# Patient Record
Sex: Female | Born: 1980 | Race: White | Hispanic: No | Marital: Married | Smoking: Never smoker
Health system: Southern US, Community
[De-identification: ages and names within clinical notes are randomized; demographics above are authoritative.]

## PROBLEM LIST (undated history)

## (undated) DIAGNOSIS — E039 Hypothyroidism, unspecified: Secondary | ICD-10-CM

## (undated) DIAGNOSIS — R519 Headache, unspecified: Secondary | ICD-10-CM

## (undated) DIAGNOSIS — K219 Gastro-esophageal reflux disease without esophagitis: Secondary | ICD-10-CM

## (undated) DIAGNOSIS — E785 Hyperlipidemia, unspecified: Secondary | ICD-10-CM

## (undated) DIAGNOSIS — Z8614 Personal history of Methicillin resistant Staphylococcus aureus infection: Secondary | ICD-10-CM

## (undated) DIAGNOSIS — R51 Headache: Secondary | ICD-10-CM

## (undated) DIAGNOSIS — O021 Missed abortion: Secondary | ICD-10-CM

## (undated) DIAGNOSIS — Z803 Family history of malignant neoplasm of breast: Secondary | ICD-10-CM

## (undated) DIAGNOSIS — T7840XA Allergy, unspecified, initial encounter: Secondary | ICD-10-CM

## (undated) DIAGNOSIS — J45909 Unspecified asthma, uncomplicated: Secondary | ICD-10-CM

## (undated) DIAGNOSIS — F419 Anxiety disorder, unspecified: Secondary | ICD-10-CM

## (undated) DIAGNOSIS — D649 Anemia, unspecified: Secondary | ICD-10-CM

## (undated) DIAGNOSIS — M1990 Unspecified osteoarthritis, unspecified site: Secondary | ICD-10-CM

## (undated) DIAGNOSIS — Z8709 Personal history of other diseases of the respiratory system: Secondary | ICD-10-CM

## (undated) HISTORY — DX: Hyperlipidemia, unspecified: E78.5

## (undated) HISTORY — DX: Missed abortion: O02.1

## (undated) HISTORY — DX: Anxiety disorder, unspecified: F41.9

## (undated) HISTORY — PX: SHOULDER ARTHROSCOPY: SHX128

## (undated) HISTORY — PX: EYE SURGERY: SHX253

## (undated) HISTORY — DX: Family history of malignant neoplasm of breast: Z80.3

## (undated) HISTORY — DX: Allergy, unspecified, initial encounter: T78.40XA

## (undated) HISTORY — PX: WISDOM TOOTH EXTRACTION: SHX21

## (undated) HISTORY — PX: TONSILLECTOMY: SUR1361

## (undated) HISTORY — DX: Personal history of other diseases of the respiratory system: Z87.09

## (undated) HISTORY — PX: DILATION AND CURETTAGE OF UTERUS: SHX78

---

## 2006-12-06 HISTORY — PX: BREAST CYST ASPIRATION: SHX578

## 2007-12-07 HISTORY — PX: BREAST CYST ASPIRATION: SHX578

## 2009-03-27 ENCOUNTER — Ambulatory Visit: Payer: Self-pay | Admitting: Obstetrics & Gynecology

## 2010-02-26 ENCOUNTER — Inpatient Hospital Stay: Payer: Self-pay

## 2012-12-01 ENCOUNTER — Inpatient Hospital Stay: Payer: Self-pay | Admitting: Obstetrics and Gynecology

## 2012-12-01 LAB — CBC WITH DIFFERENTIAL/PLATELET
Basophil #: 0.1 10*3/uL (ref 0.0–0.1)
Basophil %: 0.7 %
Eosinophil #: 0.1 10*3/uL (ref 0.0–0.7)
Eosinophil %: 1 %
HCT: 38.9 % (ref 35.0–47.0)
HGB: 13.3 g/dL (ref 12.0–16.0)
Lymphocyte #: 1.6 10*3/uL (ref 1.0–3.6)
Lymphocyte %: 11.6 %
MCH: 32.3 pg (ref 26.0–34.0)
MCHC: 34.2 g/dL (ref 32.0–36.0)
MCV: 94 fL (ref 80–100)
Monocyte #: 0.7 x10 3/mm (ref 0.2–0.9)
Monocyte %: 5 %
Neutrophil #: 11.5 10*3/uL — ABNORMAL HIGH (ref 1.4–6.5)
Neutrophil %: 81.7 %
Platelet: 146 10*3/uL — ABNORMAL LOW (ref 150–440)
RBC: 4.13 10*6/uL (ref 3.80–5.20)
RDW: 14.1 % (ref 11.5–14.5)
WBC: 14.1 10*3/uL — ABNORMAL HIGH (ref 3.6–11.0)

## 2012-12-02 LAB — HEMATOCRIT: HCT: 36 % (ref 35.0–47.0)

## 2015-04-15 NOTE — H&P (Signed)
L&D Evaluation:  History:   HPI 34yo G2P1001 at 5557w1d presented with c/o contractions increasing in intensity and frequency.  No LOF or VB.  Good FM.  [facility]  PNL - O+ , GBS neg, RI, VNI    Presents with contractions    Patient's Medical History No Chronic Illness    Patient's Surgical History none    Medications Pre Natal Vitamins    Allergies NKDA    Social History none    Family History Non-Contributory   ROS:   ROS All systems were reviewed.  HEENT, CNS, GI, GU, Respiratory, CV, Renal and Musculoskeletal systems were found to be normal.   Exam:   Vital Signs stable    General no apparent distress    Chest normal effort    Abdomen gravid, non-tender    Estimated Fetal Weight Average for gestational age, 8#    Pelvic no external lesions, 3cm to 4/100 over 4 hours on L&D    Mebranes Intact    FHT normal rate with no decels, reactive NST    Ucx regular    Skin dry   Impression:   Impression active labor, at 2057w1d   Plan:   Plan EFM/NST, monitor contractions and for cervical change, continue expectant management   Electronic Signatures: Garnette GunnerStansbury Clipp, Ali LoweEryn K (MD)  (Signed 27-Dec-13 16:06)  Authored: L&D Evaluation   Last Updated: 27-Dec-13 16:06 by Garnette GunnerStansbury Clipp, Ali LoweEryn K (MD)

## 2015-06-25 ENCOUNTER — Other Ambulatory Visit: Payer: Self-pay | Admitting: Orthopedic Surgery

## 2015-06-25 DIAGNOSIS — M25561 Pain in right knee: Secondary | ICD-10-CM

## 2015-07-02 ENCOUNTER — Ambulatory Visit: Payer: Self-pay

## 2015-07-03 ENCOUNTER — Ambulatory Visit
Admission: RE | Admit: 2015-07-03 | Discharge: 2015-07-03 | Disposition: A | Discharge status: Home / Self Care | Payer: BLUE CROSS/BLUE SHIELD | Source: Ambulatory Visit | Attending: Orthopedic Surgery | Admitting: Orthopedic Surgery

## 2015-07-03 DIAGNOSIS — M25561 Pain in right knee: Secondary | ICD-10-CM | POA: Diagnosis present

## 2015-07-03 DIAGNOSIS — M2240 Chondromalacia patellae, unspecified knee: Secondary | ICD-10-CM | POA: Diagnosis not present

## 2017-10-18 DIAGNOSIS — B9689 Other specified bacterial agents as the cause of diseases classified elsewhere: Secondary | ICD-10-CM | POA: Diagnosis not present

## 2017-10-18 DIAGNOSIS — J209 Acute bronchitis, unspecified: Secondary | ICD-10-CM | POA: Diagnosis not present

## 2017-10-18 DIAGNOSIS — J019 Acute sinusitis, unspecified: Secondary | ICD-10-CM | POA: Diagnosis not present

## 2017-11-24 DIAGNOSIS — B9689 Other specified bacterial agents as the cause of diseases classified elsewhere: Secondary | ICD-10-CM | POA: Diagnosis not present

## 2017-11-24 DIAGNOSIS — J019 Acute sinusitis, unspecified: Secondary | ICD-10-CM | POA: Diagnosis not present

## 2017-12-21 DIAGNOSIS — M79672 Pain in left foot: Secondary | ICD-10-CM | POA: Diagnosis not present

## 2017-12-21 DIAGNOSIS — M722 Plantar fascial fibromatosis: Secondary | ICD-10-CM | POA: Diagnosis not present

## 2018-01-03 DIAGNOSIS — J029 Acute pharyngitis, unspecified: Secondary | ICD-10-CM | POA: Diagnosis not present

## 2018-01-03 DIAGNOSIS — J0101 Acute recurrent maxillary sinusitis: Secondary | ICD-10-CM | POA: Diagnosis not present

## 2018-01-03 DIAGNOSIS — J209 Acute bronchitis, unspecified: Secondary | ICD-10-CM | POA: Diagnosis not present

## 2018-01-16 ENCOUNTER — Other Ambulatory Visit: Payer: Self-pay

## 2018-01-16 DIAGNOSIS — M722 Plantar fascial fibromatosis: Secondary | ICD-10-CM | POA: Diagnosis not present

## 2018-01-16 DIAGNOSIS — M79672 Pain in left foot: Secondary | ICD-10-CM | POA: Diagnosis not present

## 2018-01-16 MED ORDER — NORGESTIM-ETH ESTRAD TRIPHASIC 0.18/0.215/0.25 MG-35 MCG PO TABS: 1.0000 | ORAL_TABLET | Freq: Every day | ORAL | 0 refills | Status: DC

## 2018-01-16 NOTE — Telephone Encounter (Signed)
Pt needing 1 month supply of OCP's to last her to annual 2/27. Pt aware we will send one month but no more can be sent in until she is seen for appt

## 2018-01-24 ENCOUNTER — Other Ambulatory Visit: Payer: Self-pay | Admitting: Otolaryngology

## 2018-01-24 DIAGNOSIS — J3501 Chronic tonsillitis: Secondary | ICD-10-CM | POA: Diagnosis not present

## 2018-01-24 DIAGNOSIS — J351 Hypertrophy of tonsils: Secondary | ICD-10-CM | POA: Diagnosis not present

## 2018-01-24 DIAGNOSIS — J329 Chronic sinusitis, unspecified: Secondary | ICD-10-CM | POA: Diagnosis not present

## 2018-01-24 DIAGNOSIS — J320 Chronic maxillary sinusitis: Secondary | ICD-10-CM | POA: Diagnosis not present

## 2018-01-27 ENCOUNTER — Ambulatory Visit
Admission: RE | Admit: 2018-01-27 | Discharge: 2018-01-27 | Disposition: A | Discharge status: Home / Self Care | Payer: 59 | Source: Ambulatory Visit | Attending: Otolaryngology | Admitting: Otolaryngology

## 2018-01-27 DIAGNOSIS — J029 Acute pharyngitis, unspecified: Secondary | ICD-10-CM | POA: Diagnosis not present

## 2018-01-27 DIAGNOSIS — J329 Chronic sinusitis, unspecified: Secondary | ICD-10-CM | POA: Insufficient documentation

## 2018-01-27 DIAGNOSIS — J0100 Acute maxillary sinusitis, unspecified: Secondary | ICD-10-CM | POA: Diagnosis not present

## 2018-02-01 ENCOUNTER — Ambulatory Visit: Payer: Self-pay | Admitting: Advanced Practice Midwife

## 2018-02-03 DIAGNOSIS — Z8614 Personal history of Methicillin resistant Staphylococcus aureus infection: Secondary | ICD-10-CM

## 2018-02-03 HISTORY — DX: Personal history of Methicillin resistant Staphylococcus aureus infection: Z86.14

## 2018-02-09 ENCOUNTER — Encounter: Payer: Self-pay | Admitting: Obstetrics & Gynecology

## 2018-02-10 ENCOUNTER — Encounter: Payer: Self-pay | Admitting: Obstetrics & Gynecology

## 2018-02-10 ENCOUNTER — Ambulatory Visit (INDEPENDENT_AMBULATORY_CARE_PROVIDER_SITE_OTHER): Payer: 59 | Admitting: Obstetrics & Gynecology

## 2018-02-10 VITALS — BP 110/80 | HR 71 | Ht 63.5 in | Wt 180.0 lb

## 2018-02-10 DIAGNOSIS — Z131 Encounter for screening for diabetes mellitus: Secondary | ICD-10-CM | POA: Diagnosis not present

## 2018-02-10 DIAGNOSIS — Z1321 Encounter for screening for nutritional disorder: Secondary | ICD-10-CM

## 2018-02-10 DIAGNOSIS — Z124 Encounter for screening for malignant neoplasm of cervix: Secondary | ICD-10-CM

## 2018-02-10 DIAGNOSIS — Z1329 Encounter for screening for other suspected endocrine disorder: Secondary | ICD-10-CM | POA: Diagnosis not present

## 2018-02-10 DIAGNOSIS — Z0000 Encounter for general adult medical examination without abnormal findings: Secondary | ICD-10-CM

## 2018-02-10 DIAGNOSIS — Z13220 Encounter for screening for lipoid disorders: Secondary | ICD-10-CM

## 2018-02-10 MED ORDER — NORGESTIM-ETH ESTRAD TRIPHASIC 0.18/0.215/0.25 MG-35 MCG PO TABS: 1.0000 | ORAL_TABLET | Freq: Every day | ORAL | 3 refills | Status: DC

## 2018-02-10 NOTE — Patient Instructions (Signed)
PAP every three years Labs soon   

## 2018-02-10 NOTE — Progress Notes (Signed)
HPI:      [name] is a 37 y.o. [id number] who LMP was Patient's last menstrual period was 02/09/2018.,she presents today for her annual examination. The patient has no complaints today. Light periods on OCPs.  Occas GSI, does Kegels. Prior h/o FCC, no recent pain or concerns. The patient is sexually active. Her last pap: approximate date 2016 and was normal. The patient does perform self breast exams.  There is notable family history of breast or ovarian cancer in her family.  The patient has regular exercise: yes.  The patient denies current symptoms of depression.    GYN History: Contraception: OCP (estrogen/progesterone)  PMHx: Past Medical History:  Diagnosis Date  . Abortion, missed   . Breast mass    Past Surgical History:  Procedure Laterality Date  . DILATION AND CURETTAGE OF UTERUS    . EYE SURGERY     Family History  Problem Relation Age of Onset  . Hypertension Mother   . Hyperlipidemia Mother   . Diabetes Mother   . Heart disease Father   . CAD Father   . Hyperlipidemia Father   . Diabetes Maternal Grandmother   . Hypertension Maternal Grandmother   . Heart disease Paternal Grandmother   . Heart disease Paternal Grandfather   . Breast cancer Maternal Aunt   . Heart disease Other    Social History   Tobacco Use  . Smoking status: Never Smoker  . Smokeless tobacco: Never Used  Substance Use Topics  . Alcohol use: Yes    Frequency: Never  . Drug use: No    Current Outpatient Medications:  .  albuterol (PROVENTIL HFA) 108 (90 Base) MCG/ACT inhaler, Inhale into the lungs., Disp: , Rfl:  .  Norgestimate-Ethinyl Estradiol Triphasic (TRINESSA, 28,) 0.18/0.215/0.25 MG-35 MCG tablet, Take 1 tablet by mouth daily., Disp: 3 Package, Rfl: 3 Allergies: Patient has no known allergies.  Review of Systems  Constitutional: Negative for chills, fever and malaise/fatigue.  HENT: Negative for congestion, sinus pain and sore throat.   Eyes: Negative for blurred  vision and pain.  Respiratory: Positive for cough and wheezing.   Cardiovascular: Negative for chest pain and leg swelling.  Gastrointestinal: Negative for abdominal pain, constipation, diarrhea, heartburn, nausea and vomiting.  Genitourinary: Negative for dysuria, frequency, hematuria and urgency.  Musculoskeletal: Positive for joint pain. Negative for back pain, myalgias and neck pain.  Skin: Negative for itching and rash.  Neurological: Positive for headaches. Negative for dizziness, tremors and weakness.  Endo/Heme/Allergies: Does not bruise/bleed easily.  Psychiatric/Behavioral: Negative for depression. The patient is nervous/anxious. The patient does not have insomnia.    Objective: BP 110/80   Pulse 71   Ht 5' 3.5" (1.613 m)   Wt 180 lb (81.6 kg)   LMP 02/09/2018   BMI 31.39 kg/m   Filed Weights   02/10/18 0925  Weight: 180 lb (81.6 kg)   Body mass index is 31.39 kg/m. Physical Exam  Constitutional: She is oriented to person, place, and time. She appears well-developed and well-nourished. No distress.  Genitourinary: Rectum normal, vagina normal and uterus normal. Pelvic exam was performed with patient supine. There is no rash or lesion on the right labia. There is no rash or lesion on the left labia. Vagina exhibits no lesion. No bleeding in the vagina. Right adnexum does not display mass and does not display tenderness. Left adnexum does not display mass and does not display tenderness. Cervix does not exhibit motion tenderness, lesion, friability or polyp.  Uterus is mobile and midaxial. Uterus is not enlarged or exhibiting a mass.  HENT:  Head: Normocephalic and atraumatic. Head is without laceration.  Right Ear: Hearing normal.  Left Ear: Hearing normal.  Nose: No epistaxis.  No foreign bodies.  Mouth/Throat: Uvula is midline, oropharynx is clear and moist and mucous membranes are normal.  Eyes: Pupils are equal, round, and reactive to light.  Neck: Normal range of  motion. Neck supple. No thyromegaly present.  Cardiovascular: Normal rate and regular rhythm. Exam reveals no gallop and no friction rub.  No murmur heard. Pulmonary/Chest: Effort normal and breath sounds normal. No respiratory distress. She has no wheezes. Right breast exhibits no mass, no skin change and no tenderness. Left breast exhibits no mass, no skin change and no tenderness.  Abdominal: Soft. Bowel sounds are normal. She exhibits no distension. There is no tenderness. There is no rebound.  Musculoskeletal: Normal range of motion.  Neurological: She is alert and oriented to person, place, and time. No cranial nerve deficit.  Skin: Skin is warm and dry.  Psychiatric: She has a normal mood and affect. Judgment normal.  Vitals reviewed.  Assessment:  ANNUAL EXAM 1. Annual physical exam   2. Screening for cervical cancer   3. Screening for cholesterol level   4. Screening for thyroid disorder   5. Encounter for vitamin deficiency screening   6. Screening for diabetes mellitus    Screening Plan:            1.  Cervical Screening-  Pap smear done today  2. Breast screening- Exam annually and mammogram>40 planned   3. Colonoscopy every 10 years, Hemoccult testing - after age 8  4. Labs To return fasting at a later date  5. Counseling for contraception: oral contraceptives (estrogen/progesterone).  Considering future pregnancy options, but not likely.  6.  She presents with a significant personal and/or family history of breast cancer maternal aunt maternal aunt (age 80s), also paternal cousins and great aunts. Details of which can be found in her medical/family history. She does not have a previously identified BRCA and Lynch syndrome mutation in her family. Due to her personal and/or family history of cancer she is a candidate for the [facility] test(s).    Risk for cancer, genetic susceptibility discussed.  Patient has declined gene testing.  Discussed BRCA as well as Lynch syndrome  and other cancer risk assessments available based on her family history and personal history. Pros and cons of testing discussed.   Other:   F/U  Return in about 1 year (around 02/11/2019) for Annual.  Barnett Applebaum, MD, [facility], [facility] 02/10/2018  9:54 AM

## 2018-02-14 ENCOUNTER — Encounter: Payer: Self-pay | Admitting: Obstetrics and Gynecology

## 2018-02-14 ENCOUNTER — Other Ambulatory Visit: Payer: 59

## 2018-02-14 DIAGNOSIS — Z1321 Encounter for screening for nutritional disorder: Secondary | ICD-10-CM | POA: Diagnosis not present

## 2018-02-14 DIAGNOSIS — Z13220 Encounter for screening for lipoid disorders: Secondary | ICD-10-CM | POA: Diagnosis not present

## 2018-02-14 DIAGNOSIS — Z1329 Encounter for screening for other suspected endocrine disorder: Secondary | ICD-10-CM | POA: Diagnosis not present

## 2018-02-14 DIAGNOSIS — Z131 Encounter for screening for diabetes mellitus: Secondary | ICD-10-CM | POA: Diagnosis not present

## 2018-02-14 DIAGNOSIS — R7989 Other specified abnormal findings of blood chemistry: Secondary | ICD-10-CM

## 2018-02-14 LAB — IGP, APTIMA HPV
HPV Aptima: NEGATIVE
PAP Smear Comment: 0

## 2018-02-15 LAB — TSH: TSH: 5.8 u[IU]/mL — ABNORMAL HIGH (ref 0.450–4.500)

## 2018-02-15 LAB — VITAMIN D 25 HYDROXY (VIT D DEFICIENCY, FRACTURES): Vit D, 25-Hydroxy: 36.3 ng/mL (ref 30.0–100.0)

## 2018-02-15 LAB — LIPID PANEL
Chol/HDL Ratio: 4.3 ratio (ref 0.0–4.4)
Cholesterol, Total: 241 mg/dL — ABNORMAL HIGH (ref 100–199)
HDL: 56 mg/dL (ref >39)
LDL Calculated: 161 mg/dL — ABNORMAL HIGH (ref 0–99)
Triglycerides: 119 mg/dL (ref 0–149)
VLDL Cholesterol Cal: 24 mg/dL (ref 5–40)

## 2018-02-15 LAB — VITAMIN B12: Vitamin B-12: 597 pg/mL (ref 232–1245)

## 2018-02-15 LAB — GLUCOSE, FASTING: Glucose, Plasma: 83 mg/dL (ref 65–99)

## 2018-02-28 DIAGNOSIS — K219 Gastro-esophageal reflux disease without esophagitis: Secondary | ICD-10-CM | POA: Diagnosis not present

## 2018-02-28 DIAGNOSIS — J3501 Chronic tonsillitis: Secondary | ICD-10-CM | POA: Diagnosis not present

## 2018-02-28 DIAGNOSIS — J320 Chronic maxillary sinusitis: Secondary | ICD-10-CM | POA: Diagnosis not present

## 2018-04-27 ENCOUNTER — Ambulatory Visit (INDEPENDENT_AMBULATORY_CARE_PROVIDER_SITE_OTHER): Payer: 59 | Admitting: Cardiovascular Disease

## 2018-04-27 ENCOUNTER — Encounter: Payer: Self-pay | Admitting: Cardiovascular Disease

## 2018-04-27 VITALS — BP 120/80 | HR 66 | Ht 64.0 in | Wt 179.0 lb

## 2018-04-27 DIAGNOSIS — R200 Anesthesia of skin: Secondary | ICD-10-CM | POA: Diagnosis not present

## 2018-04-27 DIAGNOSIS — E782 Mixed hyperlipidemia: Secondary | ICD-10-CM

## 2018-04-27 DIAGNOSIS — Z01818 Encounter for other preprocedural examination: Secondary | ICD-10-CM | POA: Diagnosis not present

## 2018-04-27 DIAGNOSIS — Z8249 Family history of ischemic heart disease and other diseases of the circulatory system: Secondary | ICD-10-CM | POA: Diagnosis not present

## 2018-04-27 NOTE — Progress Notes (Signed)
Cardiology Office Note  Date:  04/27/2018   ID:  Pamela Hamilton, DOB 1981/04/18, MRN [id number]  PCP:  Patient, No Pcp Per   Chief Complaint  Patient presents with  . New Patient (Initial Visit)    Surgical Clearance. Patient c/o leg numbness in both legs.  Patient denies chest pain and SOB. Meds reviewed verbally with patient.     HPI:  [name] is a 37 year old woman with past medical history of Hyperlipidemia Family history of premature coronary disease Who presents by referral from Dr. Andee Poles for consultation of her premature coronary disease, discussion of her risk factors, preoperative evaluation  She reports that she needs to have surgery to remove her tonsils No prior cardiac history Very concerned about risk factors given father was 36 when he first developed cardiac issues Other close relatives with cardiac disease at early age  She is mother of 2 children 8 and 5, girls Able to keep up with them, no limitations No regular exercise program  Somewhat limited by chronic knee pain  plantar fasciitis  Reports having Remote pressure in the chest,  Also has periodic reproducible chest pain on palpation left chest  EKG personally reviewed by myself on todays visit Shows no sinus rhythm rate 66 bpm no significant ST or T-wave changes    PMH:   has a past medical history of Abortion, missed, Breast mass, Family history of breast cancer, and Family history of ovarian cancer.  PSH:    Past Surgical History:  Procedure Laterality Date  . DILATION AND CURETTAGE OF UTERUS    . EYE SURGERY      Current Outpatient Medications  Medication Sig Dispense Refill  . albuterol (PROVENTIL HFA) 108 (90 Base) MCG/ACT inhaler Inhale into the lungs.    . fluticasone (FLONASE) 50 MCG/ACT nasal spray fluticasone propionate 50 mcg/actuation nasal spray,suspension    . Multiple Vitamin (MULTI-VITAMINS) TABS Take by mouth.    . Norgestimate-Ethinyl Estradiol Triphasic  (TRINESSA, 28,) 0.18/0.215/0.25 MG-35 MCG tablet Take 1 tablet by mouth daily. 3 Package 3  . Omega-3 Fatty Acids (RA FISH OIL) 1000 MG CAPS Take by mouth.     No current facility-administered medications for this visit.     Allergies:   Patient has no known allergies.   Social History:  The patient  reports that she has never smoked. She has never used smokeless tobacco. She reports that she drinks alcohol. She reports that she does not use drugs.   Family History:   family history includes Breast cancer in her cousin, maternal aunt, and paternal aunt; CAD in her father; Diabetes in her maternal grandmother and mother; Heart disease in her father, other, paternal grandfather, and paternal grandmother; Hyperlipidemia in her father and mother; Hypertension in her maternal grandmother and mother; Ovarian cancer in her cousin.    Review of Systems: Review of Systems  Constitutional: Negative.   Respiratory: Negative.   Cardiovascular: Negative.   Gastrointestinal: Negative.   Musculoskeletal: Negative.   Neurological: Negative.   Psychiatric/Behavioral: Negative.   All other systems reviewed and are negative.    PHYSICAL EXAM: VS:  BP 120/80 (BP Location: Right Arm, Patient Position: Sitting, Cuff Size: Normal)   Pulse 66   Ht  (1.626 m)   Wt 179 lb (81.2 kg)   BMI 30.73 kg/m  , BMI Body mass index is 30.73 kg/m. GEN: Well nourished, well developed, in no acute distress  HEENT: normal  Neck: no JVD, carotid bruits, or masses Cardiac: RRR;  no murmurs, rubs, or gallops,no edema  Respiratory:  clear to auscultation bilaterally, normal work of breathing GI: soft, nontender, nondistended, + BS MS: no deformity or atrophy  Skin: warm and dry, no rash Neuro:  Strength and sensation are intact Psych: euthymic mood, full affect    Recent Labs: 02/14/2018: TSH 5.800    Lipid Panel Lab Results  Component Value Date   CHOL 241 (H) 02/14/2018   HDL 56 02/14/2018   LDLCALC  161 (H) 02/14/2018   TRIG 119 02/14/2018      Wt Readings from Last 3 Encounters:  04/27/18 179 lb (81.2 kg)  02/10/18 180 lb (81.6 kg)  07/03/15 175 lb (79.4 kg)       ASSESSMENT AND PLAN:  Preoperative clearance - Plan: EKG 12-Lead Acceptable risk for surgery to remove tonsils No further testing needed  Family history of premature coronary artery disease Premature coronary disease in  first-degree relatives, and more distant relatives Much of her visit was spent discussing Risk factor modification, management of cholesterol, screening studies for risk stratification -Suggested she consider CT coronary calcium scoring -Calcium scoring above could help guide her lipid management Recommended weight loss, regular exercise program Consider a statin/Zetia  Mixed hyperlipidemia As above would stress risk factor Modification, weight loss for cholesterol She is not a diabetic, nonsmoker Consider statin, Zetia  Leg numbness Atypical in nature,does not appear to be vascular given good extremity pulses Also with periodic numbness in her arms bilaterally, Good radial artery pulses She already wears compression hose  Disposition:   F/U  As needed   Total encounter time more than 60 minutes  Greater than 50% was spent in counseling and coordination of care with the patient  Patient was seen in consultation for Dr. Andee Poles and will be referred back to his office for ongoing care of the medical issues detailed above    Orders Placed This Encounter  Procedures  . EKG 12-Lead     Signed, Dossie Arbour, M.D., Ph.D. 04/27/2018  [facility] [place], [place] [phone]

## 2018-04-27 NOTE — Patient Instructions (Addendum)
Medication Instructions:   No medication changes made  Labwork:  No new labs needed  Testing/Procedures:  No further testing at this time  Research CT coronary calcium score   Follow-Up: It was a pleasure seeing you in the office today. Please call us if you have new issues that need to be addressed before your next appt.  [phone]  Your physician wants you to follow-up in:  As needed  If you need a refill on your cardiac medications before your next appointment, please call your pharmacy.  For educational health videos Log in to : [email or web address] Or : [email or web address], password : [email or web address]

## 2018-07-04 ENCOUNTER — Encounter: Payer: Self-pay | Admitting: Obstetrics and Gynecology

## 2018-07-04 ENCOUNTER — Ambulatory Visit (INDEPENDENT_AMBULATORY_CARE_PROVIDER_SITE_OTHER): Payer: 59 | Admitting: Obstetrics and Gynecology

## 2018-07-04 VITALS — BP 130/82 | HR 73 | Ht 63.5 in | Wt 182.0 lb

## 2018-07-04 DIAGNOSIS — K219 Gastro-esophageal reflux disease without esophagitis: Secondary | ICD-10-CM | POA: Diagnosis not present

## 2018-07-04 DIAGNOSIS — Z1231 Encounter for screening mammogram for malignant neoplasm of breast: Secondary | ICD-10-CM | POA: Diagnosis not present

## 2018-07-04 DIAGNOSIS — N6312 Unspecified lump in the right breast, upper inner quadrant: Secondary | ICD-10-CM | POA: Diagnosis not present

## 2018-07-04 DIAGNOSIS — Z803 Family history of malignant neoplasm of breast: Secondary | ICD-10-CM | POA: Diagnosis not present

## 2018-07-04 DIAGNOSIS — Z1239 Encounter for other screening for malignant neoplasm of breast: Secondary | ICD-10-CM

## 2018-07-04 DIAGNOSIS — J351 Hypertrophy of tonsils: Secondary | ICD-10-CM | POA: Diagnosis not present

## 2018-07-04 DIAGNOSIS — N6310 Unspecified lump in the right breast, unspecified quadrant: Secondary | ICD-10-CM

## 2018-07-04 NOTE — Progress Notes (Signed)
Chief Complaint  Patient presents with  . Breast Mass    Lump in right breast, tender to touch, size of pea, doesnt move when pushed on x 4 days      HPI:      [name] is a 37 y.o. [id number] who LMP was Patient's last menstrual period was 06/20/2018 (approximate)., presents today for breast symptoms. She complains of RT breast mass noted on SBE 4 days ago. Area is tender after palpation. Ne erythema, nipple d/c, change in size since first noted. She drinks caffeine daily. Has mild breast tenderness before menses. Hx of RT breast cyst aspiration in past twice at [facility]. Never had a mammo. Pt has FH breast cancer in her mat aunt and pat cousin and 2 pat great aunts. No FH ovar/colon ca. [facility] but it hasn't been done in past.    Past Medical History:  Diagnosis Date  . Abortion, missed   . Breast mass   . Family history of breast cancer    genetic testing letter sent 3/19  . History of strep sore throat     Past Surgical History:  Procedure Laterality Date  . DILATION AND CURETTAGE OF UTERUS    . EYE SURGERY      Family History  Problem Relation Age of Onset  . Hypertension Mother   . Hyperlipidemia Mother   . Diabetes Mother   . Heart disease Father   . CAD Father   . Hyperlipidemia Father   . Diabetes Maternal Grandmother   . Hypertension Maternal Grandmother   . Heart disease Paternal Grandmother   . Heart disease Paternal Grandfather   . Breast cancer Maternal Aunt 43  . Heart disease Other   . Breast cancer Cousin 35  . Breast cancer Other   . Breast cancer Other     Current Outpatient Medications on File Prior to Visit  Medication Sig Dispense Refill  . albuterol (PROVENTIL HFA) 108 (90 Base) MCG/ACT inhaler Inhale into the lungs.    . Ascorbic Acid (VITAMIN C) 1000 MG tablet Take 1,000 mg by mouth daily.    . Multiple Vitamin (MULTI-VITAMINS) TABS Take by mouth.    . Norgestimate-Ethinyl Estradiol Triphasic  (TRINESSA, 28,) 0.18/0.215/0.25 MG-35 MCG tablet Take 1 tablet by mouth daily. 3 Package 3  . Omega-3 Fatty Acids (RA FISH OIL) 1000 MG CAPS Take by mouth.    . fluticasone (FLONASE) 50 MCG/ACT nasal spray fluticasone propionate 50 mcg/actuation nasal spray,suspension     No current facility-administered medications on file prior to visit.      ROS:  Review of Systems  Constitutional: Negative for fever, malaise/fatigue and weight loss.  Gastrointestinal: Negative for blood in stool, constipation, diarrhea, nausea and vomiting.  Genitourinary: Negative for dysuria, flank pain, frequency, hematuria and urgency.  Musculoskeletal: Negative for back pain.  Skin: Negative for itching and rash.  Breast ROS: positive for - new or changing breast lumps   Objective: BP 130/82   Pulse 73   Ht 5' 3.5" (1.613 m)   Wt 182 lb (82.6 kg)   LMP 06/20/2018 (Approximate)   BMI 31.73 kg/m    Physical Exam  Constitutional: She is oriented to person, place, and time. She appears well-developed.  Pulmonary/Chest: Right breast exhibits mass. Right breast exhibits no inverted nipple, no nipple discharge, no skin change and no tenderness. Left breast exhibits no inverted nipple, no mass, no nipple discharge, no skin change and no tenderness.  RT BREAST  1:20 POS WITH 1 X 2CM FIRM, MOBILE, NT MASS    Neurological: She is alert and oriented to person, place, and time. No cranial nerve deficit.  Skin: Skin is warm and dry.  Psychiatric: She has a normal mood and affect. Her behavior is normal. Thought content normal.  Vitals reviewed.   Assessment/Plan: Breast mass, right - 1:30 position. Check dx mamm and u/s.  Will f/u wiht results.  - Plan: US BREAST LTD UNI RIGHT INC AXILLA, US BREAST LTD UNI LEFT INC AXILLA, MM DIAG BREAST TOMO BILATERAL  Screening for breast cancer - Plan: US BREAST LTD UNI RIGHT INC AXILLA, US BREAST LTD UNI LEFT INC AXILLA, MM DIAG BREAST TOMO BILATERAL  Family history of  breast cancer - MyRisk testing discussed and handout given. Pt to discuss wiht husband and f/u if desires.   Orders Placed This Encounter  Procedures  . US BREAST LTD UNI RIGHT INC AXILLA    Standing Status:   Future    Standing Expiration Date:   07/05/2019    Order Specific Question:   Reason for Exam (SYMPTOM  OR DIAGNOSIS REQUIRED)    Answer:   Right breast mass    Order Specific Question:   Preferred imaging location?    Answer[facility]  . US BREAST LTD UNI LEFT INC AXILLA    Standing Status:   Future    Standing Expiration Date:   07/05/2019    Order Specific Question:   Reason for Exam (SYMPTOM  OR DIAGNOSIS REQUIRED)    Answer:   LT breast mass    Order Specific Question:   Preferred imaging location?    Answer:   [facility]  . MM DIAG BREAST TOMO BILATERAL    Standing Status:   Future    Standing Expiration Date:   07/05/2019    Order Specific Question:   Reason for Exam (SYMPTOM  OR DIAGNOSIS REQUIRED)    Answer:   breast mass    Order Specific Question:   Is the patient pregnant?    Answer:   No    Order Specific Question:   Preferred imaging location?    Answer:   [facility]      F/U  Return if symptoms worsen or fail to improve.  Alicia B. Copland, PA-C 07/04/2018 11:06 AM

## 2018-07-04 NOTE — Patient Instructions (Signed)
I value your feedback and entrusting us with your care. If you get a [facility] patient survey, I would appreciate you taking the time to let us know about your experience today. Thank you! 

## 2018-07-05 ENCOUNTER — Ambulatory Visit
Admission: RE | Admit: 2018-07-05 | Discharge: 2018-07-05 | Disposition: A | Discharge status: Home / Self Care | Payer: 59 | Source: Ambulatory Visit | Attending: Obstetrics and Gynecology | Admitting: Obstetrics and Gynecology

## 2018-07-05 DIAGNOSIS — Z1239 Encounter for other screening for malignant neoplasm of breast: Secondary | ICD-10-CM

## 2018-07-05 DIAGNOSIS — N6310 Unspecified lump in the right breast, unspecified quadrant: Secondary | ICD-10-CM | POA: Diagnosis not present

## 2018-07-05 DIAGNOSIS — Z1231 Encounter for screening mammogram for malignant neoplasm of breast: Secondary | ICD-10-CM | POA: Diagnosis not present

## 2018-07-05 DIAGNOSIS — N6001 Solitary cyst of right breast: Secondary | ICD-10-CM | POA: Diagnosis not present

## 2018-07-05 DIAGNOSIS — R922 Inconclusive mammogram: Secondary | ICD-10-CM | POA: Diagnosis not present

## 2018-07-06 ENCOUNTER — Encounter
Admission: RE | Admit: 2018-07-06 | Discharge: 2018-07-06 | Disposition: A | Discharge status: Home / Self Care | Payer: 59 | Source: Ambulatory Visit | Attending: Otolaryngology | Admitting: Otolaryngology

## 2018-07-06 ENCOUNTER — Other Ambulatory Visit: Payer: Self-pay

## 2018-07-06 HISTORY — DX: Hypothyroidism, unspecified: E03.9

## 2018-07-06 HISTORY — DX: Anemia, unspecified: D64.9

## 2018-07-06 HISTORY — DX: Gastro-esophageal reflux disease without esophagitis: K21.9

## 2018-07-06 HISTORY — DX: Unspecified osteoarthritis, unspecified site: M19.90

## 2018-07-06 HISTORY — DX: Headache: R51

## 2018-07-06 HISTORY — DX: Unspecified asthma, uncomplicated: J45.909

## 2018-07-06 HISTORY — DX: Headache, unspecified: R51.9

## 2018-07-06 NOTE — Patient Instructions (Addendum)
Your procedure is scheduled on: 07-13-18 THURSDAY Report to Same Day Surgery 2nd floor medical mall [facility] Entrance-take elevator on left to 2nd floor.  Check in with surgery information desk.) To find out your arrival time please call [phone] between 1PM - 3PM on 07-12-18 [facility]  Remember: Instructions that are not followed completely may result in serious medical risk, up to and including death, or upon the discretion of your surgeon and anesthesiologist your surgery may need to be rescheduled.    _x___ 1. Do not eat food after midnight the night before your procedure. NO GUM OR CANDY AFTER MIDNIGHT.  You may drink clear liquids up to 2 hours before you are scheduled to arrive at the hospital for your procedure.  Do not drink clear liquids within 2 hours of your scheduled arrival to the hospital.  Clear liquids include  --Water or Apple juice without pulp  --Clear carbohydrate beverage such as ClearFast or Gatorade  --Black Coffee or Clear Tea (No milk, no creamers, do not add anything to the coffee or Tea    ____Ensure clear carbohydrate drink on the way to the hospital for bariatric patients  ____Ensure clear carbohydrate drink 3 hours before surgery for Dr Rutherford Nail patients if physician instructed.      __x__ 2. No Alcohol for 24 hours before or after surgery.   __x__3. No Smoking or e-cigarettes for 24 prior to surgery.  Do not use any chewable tobacco products for at least 6 hour prior to surgery   ____  4. Bring all medications with you on the day of surgery if instructed.    __x__ 5. Notify your doctor if there is any change in your medical condition     (cold, fever, infections).    x___6. On the morning of surgery brush your teeth with toothpaste and water.  You may rinse your mouth with mouth wash if you wish.  Do not swallow any toothpaste or mouthwash.   Do not wear jewelry, make-up, hairpins, clips or nail polish.  Do not wear lotions, powders, or  perfumes. You may wear deodorant.  Do not shave 48 hours prior to surgery. Men may shave face and neck.  Do not bring valuables to the hospital.    [facility] is not responsible for any belongings or valuables.               Contacts, dentures or bridgework may not be worn into surgery.  Leave your suitcase in the car. After surgery it may be brought to your room.  For patients admitted to the hospital, discharge time is determined by your treatment team.  _  Patients discharged the day of surgery will not be allowed to drive home.  You will need someone to drive you home and stay with you the night of your procedure.    Please read over the following fact sheets that you were given:   [facility] Preparing for Surgery   ____ Take anti-hypertensive listed below, cardiac, seizure, asthma,anti-reflux and psychiatric medicines. These include:  1. NONE  2.  3.  4.  5.  6.  ____Fleets enema or Magnesium Citrate as directed.   ____ Use CHG Soap or sage wipes as directed on instruction sheet   _X___ Use inhalers on the day of surgery and bring to hospital day of surgery-BRING YOUR ALBUTEROL INHALER TO HOSPITAL  ____ Stop Metformin and Janumet 2 days prior to surgery.    ____ Take 1/2 of usual insulin dose  the night before surgery and none on the morning surgery.   ____ Follow recommendations from Cardiologist, Pulmonologist or PCP regarding stopping Aspirin, Coumadin, Plavix ,Eliquis, Effient, or Pradaxa, and Pletal.  X____Stop Anti-inflammatories such as Advil, Aleve, Ibuprofen, Motrin, Naproxen, Naprosyn, Goodies powders or aspirin products NOW-OK to take Tylenol    _X___ Stop supplements until after surgery-STOP FISH OIL NOW-MAY RESUME AFTER SURGERY   ____ Bring C-Pap to the hospital.

## 2018-07-07 ENCOUNTER — Inpatient Hospital Stay: Admission: RE | Admit: 2018-07-07 | Payer: 59 | Source: Ambulatory Visit

## 2018-07-10 ENCOUNTER — Encounter
Admission: RE | Admit: 2018-07-10 | Discharge: 2018-07-10 | Disposition: A | Discharge status: Home / Self Care | Payer: 59 | Source: Ambulatory Visit | Attending: Otolaryngology | Admitting: Otolaryngology

## 2018-07-10 DIAGNOSIS — J3501 Chronic tonsillitis: Secondary | ICD-10-CM | POA: Diagnosis not present

## 2018-07-10 LAB — HEMOGLOBIN: Hemoglobin: 14.3 g/dL (ref 12.0–16.0)

## 2018-07-12 ENCOUNTER — Encounter: Payer: Self-pay | Admitting: *Deleted

## 2018-07-12 NOTE — Pre-Procedure Instructions (Signed)
CARDIAC CLEARANCE ON CHART FROM DR GOLLAN 

## 2018-07-13 ENCOUNTER — Ambulatory Visit: Payer: 59 | Admitting: Anesthesiology

## 2018-07-13 ENCOUNTER — Encounter
Admission: RE | Disposition: A | Discharge status: Home / Self Care | Payer: Self-pay | Source: Ambulatory Visit | Attending: Otolaryngology

## 2018-07-13 ENCOUNTER — Ambulatory Visit
Admission: RE | Admit: 2018-07-13 | Discharge: 2018-07-13 | Disposition: A | Discharge status: Home / Self Care | Payer: 59 | Source: Ambulatory Visit | Attending: Otolaryngology | Admitting: Otolaryngology

## 2018-07-13 ENCOUNTER — Other Ambulatory Visit: Payer: Self-pay

## 2018-07-13 DIAGNOSIS — J3501 Chronic tonsillitis: Secondary | ICD-10-CM | POA: Insufficient documentation

## 2018-07-13 DIAGNOSIS — J351 Hypertrophy of tonsils: Secondary | ICD-10-CM | POA: Diagnosis not present

## 2018-07-13 HISTORY — PX: TONSILLECTOMY: SHX5217

## 2018-07-13 HISTORY — DX: Personal history of Methicillin resistant Staphylococcus aureus infection: Z86.14

## 2018-07-13 LAB — POCT PREGNANCY, URINE: Preg Test, Ur: NEGATIVE

## 2018-07-13 SURGERY — TONSILLECTOMY
Anesthesia: General

## 2018-07-13 MED ORDER — ONDANSETRON HCL 4 MG/2ML IJ SOLN: 4.0000 mg | Freq: Once | INTRAMUSCULAR | Status: DC | PRN

## 2018-07-13 MED ORDER — ACETAMINOPHEN 10 MG/ML IV SOLN
INTRAVENOUS | Status: AC
  Filled 2018-07-13: qty 100

## 2018-07-13 MED ORDER — BUPIVACAINE HCL 0.5 % IJ SOLN
INTRAMUSCULAR | Status: DC | PRN
  Administered 2018-07-13: 2.5 mL

## 2018-07-13 MED ORDER — PROPOFOL 10 MG/ML IV BOLUS
INTRAVENOUS | Status: AC
  Filled 2018-07-13: qty 20

## 2018-07-13 MED ORDER — LIDOCAINE HCL (CARDIAC) PF 100 MG/5ML IV SOSY
PREFILLED_SYRINGE | INTRAVENOUS | Status: DC | PRN
  Administered 2018-07-13: 100 mg via INTRAVENOUS

## 2018-07-13 MED ORDER — FENTANYL CITRATE (PF) 100 MCG/2ML IJ SOLN
INTRAMUSCULAR | Status: DC | PRN
  Administered 2018-07-13 (×2): 50 ug via INTRAVENOUS

## 2018-07-13 MED ORDER — LACTATED RINGERS IV SOLN
INTRAVENOUS | Status: DC | PRN
  Administered 2018-07-13: 11:00:00 via INTRAVENOUS

## 2018-07-13 MED ORDER — LIDOCAINE VISCOUS HCL 2 % MT SOLN: 10.0000 mL | Freq: Four times a day (QID) | OROMUCOSAL | 0 refills | Status: DC | PRN

## 2018-07-13 MED ORDER — BUPIVACAINE HCL (PF) 0.5 % IJ SOLN
INTRAMUSCULAR | Status: AC
  Filled 2018-07-13: qty 30

## 2018-07-13 MED ORDER — ONDANSETRON HCL 4 MG PO TABS: 4.0000 mg | ORAL_TABLET | Freq: Three times a day (TID) | ORAL | 0 refills | Status: DC | PRN

## 2018-07-13 MED ORDER — ONDANSETRON HCL 4 MG/2ML IJ SOLN
INTRAMUSCULAR | Status: DC | PRN
  Administered 2018-07-13: 4 mg via INTRAVENOUS

## 2018-07-13 MED ORDER — FAMOTIDINE 20 MG PO TABS
ORAL_TABLET | ORAL | Status: AC
  Administered 2018-07-13: 20 mg via ORAL
  Filled 2018-07-13: qty 1

## 2018-07-13 MED ORDER — SUGAMMADEX SODIUM 500 MG/5ML IV SOLN
INTRAVENOUS | Status: DC | PRN
  Administered 2018-07-13: 200 mg via INTRAVENOUS

## 2018-07-13 MED ORDER — FENTANYL CITRATE (PF) 100 MCG/2ML IJ SOLN
25.0000 ug | INTRAMUSCULAR | Status: DC | PRN
  Administered 2018-07-13 (×4): 25 ug via INTRAVENOUS

## 2018-07-13 MED ORDER — HYDROMORPHONE HCL 1 MG/ML IJ SOLN
INTRAMUSCULAR | Status: AC
  Filled 2018-07-13: qty 1

## 2018-07-13 MED ORDER — FENTANYL CITRATE (PF) 250 MCG/5ML IJ SOLN
INTRAMUSCULAR | Status: AC
  Filled 2018-07-13: qty 5

## 2018-07-13 MED ORDER — FAMOTIDINE 20 MG PO TABS
20.0000 mg | ORAL_TABLET | Freq: Once | ORAL | Status: AC
  Administered 2018-07-13: 20 mg via ORAL

## 2018-07-13 MED ORDER — HYDROMORPHONE HCL 1 MG/ML IJ SOLN
INTRAMUSCULAR | Status: DC | PRN
  Administered 2018-07-13: .4 mg via INTRAVENOUS

## 2018-07-13 MED ORDER — LACTATED RINGERS IV SOLN
INTRAVENOUS | Status: DC
  Administered 2018-07-13: 09:00:00 via INTRAVENOUS

## 2018-07-13 MED ORDER — FENTANYL CITRATE (PF) 100 MCG/2ML IJ SOLN
INTRAMUSCULAR | Status: AC
  Administered 2018-07-13: 25 ug via INTRAVENOUS
  Filled 2018-07-13: qty 2

## 2018-07-13 MED ORDER — PROPOFOL 10 MG/ML IV BOLUS
INTRAVENOUS | Status: DC | PRN
  Administered 2018-07-13: 160 mg via INTRAVENOUS

## 2018-07-13 MED ORDER — OXYMETAZOLINE HCL 0.05 % NA SOLN
NASAL | Status: AC
  Filled 2018-07-13: qty 15

## 2018-07-13 MED ORDER — GLYCOPYRROLATE 0.2 MG/ML IJ SOLN
INTRAMUSCULAR | Status: DC | PRN
  Administered 2018-07-13: 0.2 mg via INTRAVENOUS

## 2018-07-13 MED ORDER — OXYCODONE HCL 5 MG/5ML PO SOLN: 10.0000 mg | Freq: Four times a day (QID) | ORAL | 0 refills | Status: AC | PRN

## 2018-07-13 MED ORDER — DEXAMETHASONE SODIUM PHOSPHATE 10 MG/ML IJ SOLN
INTRAMUSCULAR | Status: DC | PRN
  Administered 2018-07-13: 10 mg via INTRAVENOUS

## 2018-07-13 MED ORDER — ROCURONIUM BROMIDE 100 MG/10ML IV SOLN
INTRAVENOUS | Status: DC | PRN
  Administered 2018-07-13: 30 mg via INTRAVENOUS

## 2018-07-13 MED ORDER — ACETAMINOPHEN 10 MG/ML IV SOLN
INTRAVENOUS | Status: DC | PRN
  Administered 2018-07-13: 1000 mg via INTRAVENOUS

## 2018-07-13 SURGICAL SUPPLY — 14 items
BLADE BOVIE TIP EXT 4 (BLADE) ×2 IMPLANT
CANISTER SUCT 1200ML W/VALVE (MISCELLANEOUS) ×2 IMPLANT
CATH ROBINSON RED A/P 10FR (CATHETERS) ×2 IMPLANT
CATH ROBINSON RED A/P 14FR (CATHETERS) ×2 IMPLANT
COAG SUCT 10F 3.5MM HAND CTRL (MISCELLANEOUS) ×2 IMPLANT
ELECT REM PT RETURN 9FT ADLT (ELECTROSURGICAL) ×2
ELECTRODE REM PT RTRN 9FT ADLT (ELECTROSURGICAL) ×1 IMPLANT
GLOVE BIO SURGEON STRL SZ7.5 (GLOVE) ×2 IMPLANT
HANDLE SUCTION POOLE (INSTRUMENTS) ×1 IMPLANT
KIT TURNOVER KIT A (KITS) ×2 IMPLANT
NS IRRIG 500ML POUR BTL (IV SOLUTION) ×2 IMPLANT
PACK HEAD/NECK (MISCELLANEOUS) ×2 IMPLANT
SUCTION POOLE HANDLE (INSTRUMENTS) ×2
SYR 3ML LL SCALE MARK (SYRINGE) ×2 IMPLANT

## 2018-07-13 NOTE — H&P (Signed)
..  History and Physical paper copy reviewed and updated date of procedure and will be scanned into system.  Patient seen and examined.  

## 2018-07-13 NOTE — Anesthesia Preprocedure Evaluation (Signed)
Anesthesia Evaluation  Patient identified by MRN, date of birth, ID band Patient awake    Reviewed: Allergy & Precautions, NPO status , Patient's Chart, lab work & pertinent test results  Airway Mallampati: II  TM Distance: >3 FB     Dental   Pulmonary asthma ,    Pulmonary exam normal        Cardiovascular negative cardio ROS Normal cardiovascular exam     Neuro/Psych  Headaches, negative psych ROS   GI/Hepatic Neg liver ROS, GERD  ,  Endo/Other  Hypothyroidism   Renal/GU negative Renal ROS  negative genitourinary   Musculoskeletal  (+) Arthritis ,   Abdominal Normal abdominal exam  (+)   Peds negative pediatric ROS (+)  Hematology  (+) anemia ,   Anesthesia Other Findings   Reproductive/Obstetrics                             Anesthesia Physical Anesthesia Plan  ASA: II  Anesthesia Plan: General   Post-op Pain Management:    Induction: Intravenous  PONV Risk Score and Plan:   Airway Management Planned: Oral ETT  Additional Equipment:   Intra-op Plan:   Post-operative Plan: Extubation in OR  Informed Consent: I have reviewed the patients History and Physical, chart, labs and discussed the procedure including the risks, benefits and alternatives for the proposed anesthesia with the patient or authorized representative who has indicated his/her understanding and acceptance.   Dental advisory given  Plan Discussed with: CRNA and Surgeon  Anesthesia Plan Comments:         Anesthesia Quick Evaluation

## 2018-07-13 NOTE — Discharge Instructions (Addendum)
AMBULATORY SURGERY  °DISCHARGE INSTRUCTIONS ° ° °1) The drugs that you were given will stay in your system until tomorrow so for the next 24 hours you should not: ° °A) Drive an automobile °B) Make any legal decisions °C) Drink any alcoholic beverage ° ° °2) You may resume regular meals tomorrow.  Today it is better to start with liquids and gradually work up to solid foods. ° °You may eat anything you prefer, but it is better to start with liquids, then soup and crackers, and gradually work up to solid foods. ° ° °3) Please notify your doctor immediately if you have any unusual bleeding, trouble breathing, redness and pain at the surgery site, drainage, fever, or pain not relieved by medication. ° ° ° °4) Additional Instructions: ° ° ° ° ° ° ° °Please contact your physician with any problems or Same Day Surgery at [phone], Monday through Friday 6 am to 4 pm, or [facility] [place] [facility] number at [phone]. ° ° ° ° ° °Tonsillectomy, Adult, Care After °This sheet gives you information about how to care for yourself after your procedure. Your doctor may also give you more specific instructions. If you have problems or questions, contact your doctor. °Follow these instructions at home: °Eating and drinking °· Follow instructions from your doctor about eating and drinking. °· For many days after surgery, choose foods that are soft and cold. Examples are: °? Gelatin. °? Sherbet. °? Ice cream. °? Frozen ice pops. °· If you have an upset stomach (are nauseous), choose liquids that are cold and that you can see through (clear liquids). Examples are water and apple juice without pulp. You can try thick liquids and soft foods when you can eat without throwing up (vomiting) and without too much pain. Examples are: °? Creamed soups. °? Soft, warm cereals, such as oatmeal or hot wheat cereal. °? Milk. °? Mashed potatoes. °? Applesauce. °· Drink enough fluid to keep your pee (urine) clear or pale  yellow. °Driving °· Do not drive for 24 hours if you were given a medicine to help you relax (sedative). °· Do not drive or use heavy machinery while taking prescription pain medicine or until your health care provider approves. °General instructions °· Rest. °· Keep your head raised (elevated) when lying down. °· Take medicines only as told by your doctor. These include over-the-counter medicines and prescription medicines. °· Do not use mouthwashes until your doctor says it is okay. °· Gargle only as told by your doctor. °· Stay away from people who are sick. °Contact a doctor if: °· Your pain gets worse or medicines do not help. °· You have a fever. °· You have a rash. °· You feel light-headed or you pass out (faint). °· You cannot swallow even a little liquid or spit (saliva). °· Your pee is very dark. °Get help right away if: °· You have trouble breathing. °· You bleed bright red blood from your throat. °· You throw up bright red blood. °Summary °· Follow instructions from your doctor about what you cannot eat or drink. For many days after surgery, choose foods that are soft and cold. °· Talk with your doctor about how to keep your pain under control. This can help you rest and swallow better. °· Get help right away if you bleed bright red blood from your throat or you throw up bright red blood. °This information is not intended to replace advice given to you by your health care provider. Make   sure you discuss any questions you have with your health care provider. °Document Released: 12/25/2010 Document Revised: 10/15/2016 Document Reviewed: 10/15/2016 °Elsevier Interactive Patient Education © 2017 [employer] ° ° ° ° ° ° ° ° °

## 2018-07-13 NOTE — Anesthesia Post-op Follow-up Note (Signed)
Anesthesia QCDR form completed.        

## 2018-07-13 NOTE — Anesthesia Procedure Notes (Signed)
Procedure Name: Intubation Date/Time: 07/13/2018 10:43 AM Performed by: Justus Memory, CRNA Pre-anesthesia Checklist: Patient identified, Patient being monitored, Timeout performed, Emergency Drugs available and Suction available Patient Re-evaluated:Patient Re-evaluated prior to induction Oxygen Delivery Method: Circle system utilized Preoxygenation: Pre-oxygenation with 100% oxygen Induction Type: IV induction Ventilation: Mask ventilation without difficulty Laryngoscope Size: Mac and 3 Grade View: Grade I Tube type: Oral Tube size: 7.0 mm Number of attempts: 1 Airway Equipment and Method: Stylet Placement Confirmation: ETT inserted through vocal cords under direct vision,  positive ETCO2 and breath sounds checked- equal and bilateral Secured at: 21 cm Tube secured with: Tape Dental Injury: Teeth and Oropharynx as per pre-operative assessment

## 2018-07-13 NOTE — Anesthesia Postprocedure Evaluation (Signed)
Anesthesia Post Note  Patient: Pamela Hamilton  Procedure(s) Performed: TONSILLECTOMY (N/A )  Patient location during evaluation: PACU Anesthesia Type: General Level of consciousness: awake and alert and oriented Pain management: pain level controlled Vital Signs Assessment: post-procedure vital signs reviewed and stable Respiratory status: spontaneous breathing Cardiovascular status: blood pressure returned to baseline Anesthetic complications: no     Last Vitals:  Vitals:   07/13/18 1201 07/13/18 1303  BP: 119/83 113/65  Pulse: 79 63  Resp: 16 16  Temp: 36.4 C (!) 36.2 C  SpO2: 98% 100%    Last Pain:  Vitals:   07/13/18 1303  TempSrc: Temporal  PainSc: 3                  Daivion Pape

## 2018-07-13 NOTE — Op Note (Signed)
..  07/13/2018  11:10 AM    Crista CurbStephens, Dorraine  [id number]   Pre-Op Dx:  CHRONIC TONSILLITIS  Post-op Dx: CHRONIC TONSILLITIS  Proc:Tonsillectomy > age 37  Surg: Kayleb Warshaw  Anes:  General Endotracheal  EBL:  <695ml  Comp:  None  Findings:  3+ cryptic tonsils with pockets of inflammation  Procedure: After the patient was identified in holding and the history and physical and consent was reviewed, the patient was taken to the operating room and placed in a supine position.  General endotracheal anesthesia was induced in the normal fashion.  At this time, the patient was rotated 45 degrees and a shoulder roll was placed.  At this time, a McIvor mouthgag was inserted into the patient's oral cavity and suspended from the Mayo stand without injury to teeth, lips, or gums.  Next a red rubber catheter was inserted into the patient left nostril for retraction of the uvula and soft palate superiorly.  Next a curved Alice clamp was attached to the patient's right superior tonsillar pole and retracted medially and inferiorly.  A Bovie electrocautery was used to dissect the patient's right tonsil in a subcapsular plane.  Meticulous hemostasis was achieved with Bovie suction cautery.  At this time, the mouth gag was released from suspension for 1 minute.  Attention now was directed to the patient's left side.  In a similar fashion the curved Alice clamp was attached to the superior pole and this was retracted medially and inferiorly and the tonsil was excised in a subcapsular plane with Bovie electrocautery.  After completion of the second tonsil, meticulous hemostasis was continued.  At this time, attention was directed to the patient's Adenoids, with an operating mirror the adenoids were visualized and noted to be normal in appearance.  At this time, the patient's nasal cavity and oral cavity was irrigated with sterile saline.  T1.1005ml of 0.5% Marcaine was injected into the anterior and  posterior tonsillar fossa bilaterally.  Following this  The care of patient was returned to anesthesia, awakened, and transferred to recovery in stable condition.  Dispo:  PACU to home  Plan: Soft diet.  Limit exercise and strenuous activity for 2 weeks.  Fluid hydration  Recheck my office three weeks.   Hedda Crumbley 11:10 AM 07/13/2018

## 2018-07-13 NOTE — Transfer of Care (Signed)
Immediate Anesthesia Transfer of Care Note  Patient: Pamela Hamilton  Procedure(s) Performed: TONSILLECTOMY (N/A )  Patient Location: PACU  Anesthesia Type:General  Level of Consciousness: sedated  Airway & Oxygen Therapy: Patient Spontanous Breathing and Patient connected to face mask oxygen  Post-op Assessment: Report given to RN and Post -op Vital signs reviewed and stable  Post vital signs: Reviewed and stable  Last Vitals:  Vitals Value Taken Time  BP 106/79 07/13/2018 11:20 AM  Temp 36.3 C 07/13/2018 11:20 AM  Pulse 90 07/13/2018 11:24 AM  Resp 13 07/13/2018 11:24 AM  SpO2 99 % 07/13/2018 11:24 AM  Vitals shown include unvalidated device data.  Last Pain:  Vitals:   07/13/18 1120  TempSrc:   PainSc: 7          Complications: none

## 2018-07-14 LAB — SURGICAL PATHOLOGY

## 2018-10-17 DIAGNOSIS — J0141 Acute recurrent pansinusitis: Secondary | ICD-10-CM | POA: Diagnosis not present

## 2018-12-08 DIAGNOSIS — R05 Cough: Secondary | ICD-10-CM | POA: Diagnosis not present

## 2018-12-08 DIAGNOSIS — J0110 Acute frontal sinusitis, unspecified: Secondary | ICD-10-CM | POA: Diagnosis not present

## 2019-03-28 ENCOUNTER — Telehealth: Payer: 59 | Admitting: Family

## 2019-03-28 DIAGNOSIS — R05 Cough: Secondary | ICD-10-CM | POA: Diagnosis not present

## 2019-03-28 DIAGNOSIS — R059 Cough, unspecified: Secondary | ICD-10-CM

## 2019-03-28 MED ORDER — PREDNISONE 10 MG (21) PO TBPK
ORAL_TABLET | ORAL | 0 refills | Status: DC
Start: 2019-03-28 — End: 2019-06-12

## 2019-03-28 MED ORDER — AMOXICILLIN-POT CLAVULANATE 875-125 MG PO TABS: 1.0000 | ORAL_TABLET | Freq: Two times a day (BID) | ORAL | 0 refills | Status: DC

## 2019-03-28 NOTE — Addendum Note (Signed)
Addended by: Worthy Rancher B on: 03/28/2019 02:09 PM   Modules accepted: Orders

## 2019-03-28 NOTE — Progress Notes (Signed)
We are sorry that you are not feeling well.  Here is how we plan to help!  Based on your presentation I believe you most likely have A cough due to a virus.  This is called viral bronchitis and is best treated by rest, plenty of fluids and control of the cough.  You may use Ibuprofen or Tylenol as directed to help your symptoms.     In addition you may use A non-prescription cough medication called Robitussin DAC. Take 2 teaspoons every 8 hours or Delsym: take 2 teaspoons every 12 hours.  Prednisone 10 mg daily for 6 days (see taper instructions below)  Directions for 6 day taper: Day 1: 2 tablets before breakfast, 1 after both lunch & dinner and 2 at bedtime Day 2: 1 tab before breakfast, 1 after both lunch & dinner and 2 at bedtime Day 3: 1 tab at each meal & 1 at bedtime Day 4: 1 tab at breakfast, 1 at lunch, 1 at bedtime Day 5: 1 tab at breakfast & 1 tab at bedtime Day 6: 1 tab at breakfast   From your responses in the eVisit questionnaire you describe inflammation in the upper respiratory tract which is causing a significant cough.  This is commonly called Bronchitis and has four common causes:    Allergies  Viral Infections  Acid Reflux  Bacterial Infection Allergies, viruses and acid reflux are treated by controlling symptoms or eliminating the cause. An example might be a cough caused by taking certain blood pressure medications. You stop the cough by changing the medication. Another example might be a cough caused by acid reflux. Controlling the reflux helps control the cough.  USE OF BRONCHODILATOR ("RESCUE") INHALERS: There is a risk from using your bronchodilator too frequently.  The risk is that over-reliance on a medication which only relaxes the muscles surrounding the breathing tubes can reduce the effectiveness of medications prescribed to reduce swelling and congestion of the tubes themselves.  Although you feel brief relief from the bronchodilator inhaler, your asthma  may actually be worsening with the tubes becoming more swollen and filled with mucus.  This can delay other crucial treatments, such as oral steroid medications. If you need to use a bronchodilator inhaler daily, several times per day, you should discuss this with your provider.  There are probably better treatments that could be used to keep your asthma under control.     HOME CARE . Only take medications as instructed by your medical team. . Complete the entire course of an antibiotic. . Drink plenty of fluids and get plenty of rest. . Avoid close contacts especially the very young and the elderly . Cover your mouth if you cough or cough into your sleeve. . Always remember to wash your hands . A steam or ultrasonic humidifier can help congestion.   GET HELP RIGHT AWAY IF: . You develop worsening fever. . You become short of breath . You cough up blood. . Your symptoms persist after you have completed your treatment plan MAKE SURE YOU   Understand these instructions.  Will watch your condition.  Will get help right away if you are not doing well or get worse.  Your e-visit answers were reviewed by a board certified advanced clinical practitioner to complete your personal care plan.  Depending on the condition, your plan could have included both over the counter or prescription medications. If there is a problem please reply  once you have received a response from your provider. Your   safety is important to us.  If you have drug allergies check your prescription carefully.    You can use MyChart to ask questions about today's visit, request a non-urgent call back, or ask for a work or school excuse for 24 hours related to this e-Visit. If it has been greater than 24 hours you will need to follow up with your provider, or enter a new e-Visit to address those concerns. You will get an e-mail in the next two days asking about your experience.  I hope that your e-visit has been valuable and  will speed your recovery. Thank you for using e-visits. Greater than 5 minutes, yet less than 10 minutes of time have been spent researching, coordinating, and implementing care for this patient today.  Thank you for the details you included in the comment boxes. Those details are very helpful in determining the best course of treatment for you and help us to provide the best care.  

## 2019-04-08 ENCOUNTER — Other Ambulatory Visit: Payer: Self-pay | Admitting: Obstetrics & Gynecology

## 2019-04-09 NOTE — Telephone Encounter (Signed)
Sch Annual in June-July Refills sent to pharmacy

## 2019-04-09 NOTE — Telephone Encounter (Signed)
Patient is schedule 06/12/19 with [facility]

## 2019-06-12 ENCOUNTER — Ambulatory Visit (INDEPENDENT_AMBULATORY_CARE_PROVIDER_SITE_OTHER): Payer: 59 | Admitting: Obstetrics & Gynecology

## 2019-06-12 ENCOUNTER — Encounter: Payer: Self-pay | Admitting: Obstetrics & Gynecology

## 2019-06-12 ENCOUNTER — Other Ambulatory Visit: Payer: Self-pay

## 2019-06-12 VITALS — BP 120/80 | Ht 64.0 in | Wt 190.0 lb

## 2019-06-12 DIAGNOSIS — E7800 Pure hypercholesterolemia, unspecified: Secondary | ICD-10-CM | POA: Diagnosis not present

## 2019-06-12 DIAGNOSIS — R7989 Other specified abnormal findings of blood chemistry: Secondary | ICD-10-CM

## 2019-06-12 DIAGNOSIS — Z3041 Encounter for surveillance of contraceptive pills: Secondary | ICD-10-CM

## 2019-06-12 DIAGNOSIS — Z01419 Encounter for gynecological examination (general) (routine) without abnormal findings: Secondary | ICD-10-CM | POA: Diagnosis not present

## 2019-06-12 MED ORDER — NORGESTIM-ETH ESTRAD TRIPHASIC 0.18/0.215/0.25 MG-35 MCG PO TABS: 1.0000 | ORAL_TABLET | Freq: Every day | ORAL | 3 refills | Status: DC

## 2019-06-12 NOTE — Progress Notes (Signed)
HPI:      [name] is a 38 y.o. [id number] who LMP was Patient's last menstrual period was 06/10/2019., she presents today for her annual examination. The patient has no complaints today. The patient is sexually active. Her last pap: approximate date 2019 and was normal and last mammogram: 2019 for brteat mass- cyst; she has had this on 3 occasions. The patient does perform self breast exams.  There is notable family history of breast or ovarian cancer in her family.  The patient has regular exercise: yes.  The patient denies current symptoms of depression.    GYN History: Contraception: OCP (estrogen/progesterone)  PMHx: Past Medical History:  Diagnosis Date  . Abortion, missed   . Anemia   . Arthritis   . Asthma    EXERCISE INDUCED  . Family history of breast cancer    genetic testing letter sent 3/19  . GERD (gastroesophageal reflux disease)    OCC NO MEDS  . Headache    MIGRAINES  . History of methicillin resistant staphylococcus aureus (MRSA) 02/2018   NASAL SWAB   . History of strep sore throat   . Hypothyroidism    Past Surgical History:  Procedure Laterality Date  . BREAST CYST ASPIRATION Right 2008  . BREAST CYST ASPIRATION Right 2009  . DILATION AND CURETTAGE OF UTERUS    . EYE SURGERY     X 3  . SHOULDER ARTHROSCOPY Right   . TONSILLECTOMY N/A 07/13/2018   Procedure: TONSILLECTOMY;  Surgeon: Bud FaceVaught, Creighton, MD;  Location: [facility] ORS;  Service: ENT;  Laterality: N/A;  . WISDOM TOOTH EXTRACTION     Family History  Problem Relation Age of Onset  . Hypertension Mother   . Hyperlipidemia Mother   . Diabetes Mother   . Heart disease Father   . CAD Father   . Hyperlipidemia Father   . Diabetes Maternal Grandmother   . Hypertension Maternal Grandmother   . Heart disease Paternal Grandmother   . Heart disease Paternal Grandfather   . Breast cancer Maternal Aunt 43  . Heart disease Other   . Breast cancer Cousin 35  . Breast cancer Other   . Breast  cancer Other    Social History   Tobacco Use  . Smoking status: Never Smoker  . Smokeless tobacco: Never Used  Substance Use Topics  . Alcohol use: Yes    Frequency: Never    Comment: OCC  . Drug use: No    Current Outpatient Medications:  Marland Kitchen.  Multiple Vitamin (MULTI-VITAMINS) TABS, Take 1 tablet by mouth every evening. , Disp: , Rfl:  .  Norgestimate-Ethinyl Estradiol Triphasic (TRI-SPRINTEC) 0.18/0.215/0.25 MG-35 MCG tablet, Take 1 tablet by mouth daily., Disp: 84 tablet, Rfl: 3 .  Omega-3 Fatty Acids (FISH OIL ULTRA) 1400 MG CAPS, Take 1,400 mg by mouth every evening., Disp: , Rfl:  .  vitamin C (ASCORBIC ACID) 500 MG tablet, Take 500 mg by mouth every evening., Disp: , Rfl:  .  acetaminophen (TYLENOL) 500 MG tablet, Take 1,000 mg by mouth 2 (two) times daily as needed for moderate pain or headache., Disp: , Rfl:  .  albuterol (PROVENTIL HFA) 108 (90 Base) MCG/ACT inhaler, Inhale 2 puffs into the lungs every 6 (six) hours as needed for wheezing or shortness of breath. , Disp: , Rfl:  .  fluticasone (FLONASE) 50 MCG/ACT nasal spray, Place 1 spray into both nostrils daily as needed for allergies or rhinitis., Disp: , Rfl:  .  Hypromellose (ARTIFICIAL  TEARS OP), Place 1 drop into both eyes daily as needed (dry eyes)., Disp: , Rfl:  .  loratadine (CLARITIN) 10 MG tablet, Take 10 mg by mouth daily as needed for allergies., Disp: , Rfl:  .  Menthol, Topical Analgesic, (ICY HOT EX), Apply 1 application topically daily as needed (pain)., Disp: , Rfl:  .  sodium chloride (OCEAN) 0.65 % SOLN nasal spray, Place 1 spray into both nostrils as needed for congestion., Disp: , Rfl:  Allergies: Versed [midazolam]  Review of Systems  Constitutional: Negative for chills, fever and malaise/fatigue.  HENT: Negative for congestion, sinus pain and sore throat.   Eyes: Negative for blurred vision and pain.  Respiratory: Negative for cough and wheezing.   Cardiovascular: Negative for chest pain and leg  swelling.  Gastrointestinal: Negative for abdominal pain, constipation, diarrhea, heartburn, nausea and vomiting.  Genitourinary: Negative for dysuria, frequency, hematuria and urgency.  Musculoskeletal: Negative for back pain, joint pain, myalgias and neck pain.  Skin: Negative for itching and rash.  Neurological: Negative for dizziness, tremors and weakness.  Endo/Heme/Allergies: Does not bruise/bleed easily.  Psychiatric/Behavioral: Negative for depression. The patient is not nervous/anxious and does not have insomnia.     Objective: BP 120/80   Ht 5\' 4"  (1.626 m)   Wt 190 lb (86.2 kg)   LMP 06/10/2019   BMI 32.61 kg/m   Filed Weights   06/12/19 0845  Weight: 190 lb (86.2 kg)   Body mass index is 32.61 kg/m. Physical Exam Constitutional:      General: She is not in acute distress.    Appearance: She is well-developed.  Genitourinary:     Pelvic exam was performed with patient supine.     Vagina, uterus and rectum normal.     No lesions in the vagina.     No vaginal bleeding.     No cervical motion tenderness, friability, lesion or polyp.     Uterus is mobile.     Uterus is not enlarged.     No uterine mass detected.    Uterus is midaxial.     No right or left adnexal mass present.     Right adnexa not tender.     Left adnexa not tender.  HENT:     Head: Normocephalic and atraumatic. No laceration.     Right Ear: Hearing normal.     Left Ear: Hearing normal.     Mouth/Throat:     Pharynx: Uvula midline.  Eyes:     Pupils: Pupils are equal, round, and reactive to light.  Neck:     Musculoskeletal: Normal range of motion and neck supple.     Thyroid: No thyromegaly.  Cardiovascular:     Rate and Rhythm: Normal rate and regular rhythm.     Heart sounds: No murmur. No friction rub. No gallop.   Pulmonary:     Effort: Pulmonary effort is normal. No respiratory distress.     Breath sounds: Normal breath sounds. No wheezing.  Chest:     Breasts:        Right: No  mass, skin change or tenderness.        Left: No mass, skin change or tenderness.  Abdominal:     General: Bowel sounds are normal. There is no distension.     Palpations: Abdomen is soft.     Tenderness: There is no abdominal tenderness. There is no rebound.  Musculoskeletal: Normal range of motion.  Neurological:     Mental Status: She  is alert and oriented to person, place, and time.     Cranial Nerves: No cranial nerve deficit.  Skin:    General: Skin is warm and dry.  Psychiatric:        Judgment: Judgment normal.  Vitals signs reviewed.     Assessment:  ANNUAL EXAM 1. Women's annual routine gynecological examination   2. Hypercholesteremia   3. Abnormal thyroid blood test   4. Encounter for surveillance of contraceptive pills     Screening Plan:            1.  Cervical Screening-  Pap smear schedule reviewed with patient  2. Breast screening- Exam annually and mammogram>40 planned   3. Colonoscopy every 10 years, Hemoccult testing - after age 38  4. Labs Ordered today   5. Counseling for contraception: oral contraceptives (estrogen/progesterone)  - Norgestimate-Ethinyl Estradiol Triphasic (TRI-SPRINTEC) 0.18/0.215/0.25 MG-35 MCG tablet; Take 1 tablet by mouth daily.  Dispense: 84 tablet; Refill: 3  6. Hypercholesteremia  - Lipid panel   7. Abnormal thyroid blood test  - TSH     F/U  Return in about 1 year (around 06/11/2020) for Annual.  Annamarie MajorPaul Javarus Dorner, MD, Merlinda FrederickFACOG [facility], [facility] 06/12/2019  9:10 AM

## 2019-06-12 NOTE — Patient Instructions (Signed)
PAP every three years Mammogram after age 38 Labs today

## 2019-06-13 LAB — LIPID PANEL
Chol/HDL Ratio: 4.7 ratio — ABNORMAL HIGH (ref 0.0–4.4)
Cholesterol, Total: 221 mg/dL — ABNORMAL HIGH (ref 100–199)
HDL: 47 mg/dL (ref >39)
LDL Calculated: 145 mg/dL — ABNORMAL HIGH (ref 0–99)
Triglycerides: 146 mg/dL (ref 0–149)
VLDL Cholesterol Cal: 29 mg/dL (ref 5–40)

## 2019-06-13 LAB — TSH: TSH: 5.75 u[IU]/mL — ABNORMAL HIGH (ref 0.450–4.500)

## 2019-06-30 ENCOUNTER — Encounter: Payer: Self-pay | Admitting: Obstetrics and Gynecology

## 2019-08-21 DIAGNOSIS — L298 Other pruritus: Secondary | ICD-10-CM | POA: Diagnosis not present

## 2019-08-24 ENCOUNTER — Telehealth: Payer: 59 | Admitting: Physician Assistant

## 2019-08-24 DIAGNOSIS — J019 Acute sinusitis, unspecified: Secondary | ICD-10-CM | POA: Diagnosis not present

## 2019-08-24 MED ORDER — AMOXICILLIN-POT CLAVULANATE 875-125 MG PO TABS
1.0000 | ORAL_TABLET | Freq: Two times a day (BID) | ORAL | 0 refills | Status: DC
Start: 2019-08-24 — End: 2020-02-20

## 2019-08-24 NOTE — Progress Notes (Signed)

## 2019-10-30 ENCOUNTER — Telehealth: Payer: 59 | Admitting: Physician Assistant

## 2019-10-30 DIAGNOSIS — R0989 Other specified symptoms and signs involving the circulatory and respiratory systems: Secondary | ICD-10-CM | POA: Diagnosis not present

## 2019-10-30 DIAGNOSIS — J019 Acute sinusitis, unspecified: Secondary | ICD-10-CM | POA: Diagnosis not present

## 2019-10-30 DIAGNOSIS — J40 Bronchitis, not specified as acute or chronic: Secondary | ICD-10-CM | POA: Diagnosis not present

## 2019-10-30 DIAGNOSIS — Z8709 Personal history of other diseases of the respiratory system: Secondary | ICD-10-CM | POA: Diagnosis not present

## 2019-10-30 DIAGNOSIS — J069 Acute upper respiratory infection, unspecified: Secondary | ICD-10-CM

## 2019-10-30 MED ORDER — BENZONATATE 100 MG PO CAPS: 100.0000 mg | ORAL_CAPSULE | Freq: Two times a day (BID) | ORAL | 0 refills | Status: DC | PRN

## 2019-10-30 MED ORDER — FLUTICASONE PROPIONATE 50 MCG/ACT NA SUSP: 2.0000 | Freq: Every day | NASAL | 6 refills | Status: AC

## 2019-10-30 NOTE — Progress Notes (Signed)
Hello Pamela Hamilton,  Im sorry you are not feeling well. I reviewed your chart and it seems you had a recent infection in September as well. This time of the year can be difficult. Antibiotics would not be the first line treatment for this at this time. If you are developing a cough and have a history of bronchitis it could return again, Bronchitis if viral in nature.  After reviewing your chart I feel that the best plan would be to start you on a nasal steroid and cough medication. If your symptoms do not improve with this I would recommend following up in person. You also noted that you have some trouble with swallowing. If this is severe and you are having trouble you need to be seen today.   Based on what you have shared with me, it looks like you may have a viral upper respiratory infection.  Upper respiratory infections are caused by a large number of viruses; however, rhinovirus is the most common cause.   Symptoms vary from person to person, with common symptoms including sore throat, cough, fatigue or lack of energy and feeling of general discomfort.  A low-grade fever of up to 100.4 may present, but is often uncommon.  Symptoms vary however, and are closely related to a person's age or underlying illnesses.  The most common symptoms associated with an upper respiratory infection are nasal discharge or congestion, cough, sneezing, headache and pressure in the ears and face.  These symptoms usually persist for about 3 to 10 days, but can last up to 2 weeks.  It is important to know that upper respiratory infections do not cause serious illness or complications in most cases.    Upper respiratory infections can be transmitted from person to person, with the most common method of transmission being a person's hands.  The virus is able to live on the skin and can infect other persons for up to 2 hours after direct contact.  Also, these can be transmitted when someone coughs or sneezes; thus, it is  important to cover the mouth to reduce this risk.  To keep the spread of the illness at bay, good hand hygiene is very important.  This is an infection that is most likely caused by a virus. There are no specific treatments other than to help you with the symptoms until the infection runs its course.  We are sorry you are not feeling well.  Here is how we plan to help!   For nasal congestion, you may use an oral decongestants such as Mucinex D or if you have glaucoma or high blood pressure use plain Mucinex.  Saline nasal spray or nasal drops can help and can safely be used as often as needed for congestion.  For your congestion, I have prescribed Fluticasone nasal spray one spray in each nostril twice a day  If you do not have a history of heart disease, hypertension, diabetes or thyroid disease, prostate/bladder issues or glaucoma, you may also use Sudafed to treat nasal congestion.  It is highly recommended that you consult with a pharmacist or your primary care physician to ensure this medication is safe for you to take.     If you have a cough, you may use cough suppressants such as Delsym and Robitussin.  If you have glaucoma or high blood pressure, you can also use Coricidin HBP.   For cough I have prescribed for you A prescription cough medication called Tessalon Perles 100 mg. You may  take 1-2 capsules every 8 hours as needed for cough  If you have a sore or scratchy throat, use a saltwater gargle-  to  teaspoon of salt dissolved in a 4-ounce to 8-ounce glass of warm water.  Gargle the solution for approximately 15-30 seconds and then spit.  It is important not to swallow the solution.  You can also use throat lozenges/cough drops and Chloraseptic spray to help with throat pain or discomfort.  Warm or cold liquids can also be helpful in relieving throat pain.  For headache, pain or general discomfort, you can use Ibuprofen or Tylenol as directed.   Some authorities believe that zinc sprays  or the use of Echinacea may shorten the course of your symptoms.   HOME CARE . Only take medications as instructed by your medical team. . Be sure to drink plenty of fluids. Water is fine as well as fruit juices, sodas and electrolyte beverages. You may want to stay away from caffeine or alcohol. If you are nauseated, try taking small sips of liquids. How do you know if you are getting enough fluid? Your urine should be a pale yellow or almost colorless. . Get rest. . Taking a steamy shower or using a humidifier may help nasal congestion and ease sore throat pain. You can place a towel over your head and breathe in the steam from hot water coming from a faucet. . Using a saline nasal spray works much the same way. . Cough drops, hard candies and sore throat lozenges may ease your cough. . Avoid close contacts especially the very young and the elderly . Cover your mouth if you cough or sneeze . Always remember to wash your hands.   GET HELP RIGHT AWAY IF: . You develop worsening fever. . If your symptoms do not improve within 10 days . You develop yellow or green discharge from your nose over 3 days. . You have coughing fits . You develop a severe head ache or visual changes. . You develop shortness of breath, difficulty breathing or start having chest pain . Your symptoms persist after you have completed your treatment plan  MAKE SURE YOU   Understand these instructions.  Will watch your condition.  Will get help right away if you are not doing well or get worse.  Your e-visit answers were reviewed by a board certified advanced clinical practitioner to complete your personal care plan. Depending upon the condition, your plan could have included both over the counter or prescription medications. Please review your pharmacy choice. If there is a problem, you may call our nursing hot line at and have the prescription routed to another pharmacy. Your safety is important to [place]. If you  have drug allergies check your prescription carefully.   You can use MyChart to ask questions about today's visit, request a non-urgent call back, or ask for a work or school excuse for 24 hours related to this e-Visit. If it has been greater than 24 hours you will need to follow up with your provider, or enter a new e-Visit to address those concerns. You will get an e-mail in the next two days asking about your experience.  I hope that your e-visit has been valuable and will speed your recovery. Thank you for using e-visits.   Greater than 5 minutes, yet less than 10 minutes of time have been spent researching, coordinating, and implementing care for this patient today

## 2019-11-05 ENCOUNTER — Other Ambulatory Visit
Admission: RE | Admit: 2019-11-05 | Discharge: 2019-11-05 | Disposition: A | Discharge status: Home / Self Care | Payer: 59 | Source: Ambulatory Visit | Attending: Family Medicine | Admitting: Family Medicine

## 2019-11-05 DIAGNOSIS — R05 Cough: Secondary | ICD-10-CM | POA: Diagnosis not present

## 2019-11-05 DIAGNOSIS — R0989 Other specified symptoms and signs involving the circulatory and respiratory systems: Secondary | ICD-10-CM | POA: Diagnosis not present

## 2019-11-05 DIAGNOSIS — Z03818 Encounter for observation for suspected exposure to other biological agents ruled out: Secondary | ICD-10-CM | POA: Diagnosis not present

## 2019-11-05 DIAGNOSIS — R0602 Shortness of breath: Secondary | ICD-10-CM | POA: Diagnosis not present

## 2019-11-05 DIAGNOSIS — J40 Bronchitis, not specified as acute or chronic: Secondary | ICD-10-CM | POA: Diagnosis not present

## 2019-11-05 LAB — FIBRIN DERIVATIVES D-DIMER (ARMC ONLY): Fibrin derivatives D-dimer (ARMC): 236.74 ng/mL (FEU) (ref 0.00–499.00)

## 2020-02-20 ENCOUNTER — Ambulatory Visit (INDEPENDENT_AMBULATORY_CARE_PROVIDER_SITE_OTHER): Payer: 59

## 2020-02-20 ENCOUNTER — Ambulatory Visit
Admission: EM | Admit: 2020-02-20 | Discharge: 2020-02-20 | Disposition: A | Discharge status: Home / Self Care | Payer: 59 | Attending: Family Medicine | Admitting: Family Medicine

## 2020-02-20 ENCOUNTER — Other Ambulatory Visit: Payer: Self-pay

## 2020-02-20 DIAGNOSIS — R600 Localized edema: Secondary | ICD-10-CM | POA: Diagnosis not present

## 2020-02-20 DIAGNOSIS — M25571 Pain in right ankle and joints of right foot: Secondary | ICD-10-CM

## 2020-02-20 MED ORDER — MELOXICAM 15 MG PO TABS: 15.0000 mg | ORAL_TABLET | Freq: Every day | ORAL | 0 refills | Status: AC

## 2020-02-20 NOTE — ED Triage Notes (Signed)
Pt presents with c/o injury to right foot/ankle today. Pt states she heard a "pop" and has had pain with ambulation since. Pt did apply ice and elevated her foot with no relief. Pt can wiggle her toes but does have decreased ROM in her ankle.

## 2020-02-20 NOTE — ED Provider Notes (Signed)
[facility]    CSN: [id number] Arrival date & time: 02/20/20  1821      History   Chief Complaint Chief Complaint  Patient presents with  . Ankle Injury    right   HPI  39 year old female presents with the above complaint.  Patient states that she took a step off some steps onto a plank.  She states that in doing so she everted her right ankle.  She states that she heard a pop.  She reports pain particularly around the lateral malleolus.  Difficulty with ambulation.  She has applied ice and elevated without relief.  Pain is currently 3/10 in severity.  Exacerbated by activity.  No relieving factors.  No other complaints.  Past Medical History:  Diagnosis Date  . Abortion, missed   . Anemia   . Arthritis   . Asthma    EXERCISE INDUCED  . Family history of breast cancer    genetic testing letter sent 3/19, 7/20  . GERD (gastroesophageal reflux disease)    OCC NO MEDS  . Headache    MIGRAINES  . History of methicillin resistant staphylococcus aureus (MRSA) 02/2018   NASAL SWAB   . History of strep sore throat   . Hypothyroidism     Patient Active Problem List   Diagnosis Date Noted  . Preoperative clearance 04/27/2018  . Family history of premature coronary artery disease 04/27/2018  . Mixed hyperlipidemia 04/27/2018    Past Surgical History:  Procedure Laterality Date  . BREAST CYST ASPIRATION Right 2008  . BREAST CYST ASPIRATION Right 2009  . DILATION AND CURETTAGE OF UTERUS    . EYE SURGERY     X 3  . SHOULDER ARTHROSCOPY Right   . TONSILLECTOMY N/A 07/13/2018   Procedure: TONSILLECTOMY;  Surgeon: Carloyn Manner, MD;  Location: [facility] ORS;  Service: ENT;  Laterality: N/A;  . TONSILLECTOMY    . WISDOM TOOTH EXTRACTION      OB History    Gravida  3   Para  2   Term  2   Preterm      AB  1   Living  2     SAB  1   TAB      Ectopic      Multiple      Live Births  2          Home Medications    Prior to Admission  medications   Medication Sig Start Date End Date Taking? Authorizing Provider  acetaminophen (TYLENOL) 500 MG tablet Take 1,000 mg by mouth 2 (two) times daily as needed for moderate pain or headache.   Yes [provider]  albuterol (PROVENTIL HFA) 108 (90 Base) MCG/ACT inhaler Inhale 2 puffs into the lungs every 6 (six) hours as needed for wheezing or shortness of breath.  10/18/17  Yes [provider]  fluticasone (FLONASE) 50 MCG/ACT nasal spray Place 2 sprays into both nostrils daily. 10/30/19  Yes Hedges, Dellis Filbert, PA-C  Hypromellose (ARTIFICIAL TEARS OP) Place 1 drop into both eyes daily as needed (dry eyes).   Yes [provider]  loratadine (CLARITIN) 10 MG tablet Take 10 mg by mouth daily as needed for allergies.   Yes [provider]  Menthol, Topical Analgesic, (ICY HOT EX) Apply 1 application topically daily as needed (pain).   Yes [provider]  Multiple Vitamin (MULTI-VITAMINS) TABS Take 1 tablet by mouth every evening.    Yes [provider]  Norgestimate-Ethinyl Estradiol Triphasic (  TRI-SPRINTEC) 0.18/0.215/0.25 MG-35 MCG tablet Take 1 tablet by mouth daily. 06/12/19  Yes Nadara Mustard, MD  Omega-3 Fatty Acids (FISH OIL ULTRA) 1400 MG CAPS Take 1,400 mg by mouth every evening.   Yes [provider]  sodium chloride (OCEAN) 0.65 % SOLN nasal spray Place 1 spray into both nostrils as needed for congestion.   Yes [provider]  vitamin C (ASCORBIC ACID) 500 MG tablet Take 500 mg by mouth every evening.   Yes [provider]  meloxicam (MOBIC) 15 MG tablet Take 1 tablet (15 mg total) by mouth daily for 7 days. 02/20/20 02/27/20  Tommie Sams, DO    Family History Family History  Problem Relation Age of Onset  . Hypertension Mother   . Hyperlipidemia Mother   . Diabetes Mother   . Heart disease Father   . CAD Father   . Hyperlipidemia Father   . Diabetes Maternal Grandmother   . Hypertension  Maternal Grandmother   . Heart disease Paternal Grandmother   . Heart disease Paternal Grandfather   . Breast cancer Maternal Aunt 43  . Heart disease Other   . Breast cancer Cousin 35  . Breast cancer Other   . Breast cancer Other     Social History Social History   Tobacco Use  . Smoking status: Never Smoker  . Smokeless tobacco: Never Used  Substance Use Topics  . Alcohol use: Yes    Comment: OCC  . Drug use: No     Allergies   Versed [midazolam]   Review of Systems Review of Systems  Constitutional: Negative.   Musculoskeletal:       Right ankle pain.   Physical Exam Triage Vital Signs ED Triage Vitals  Enc Vitals Group     BP 02/20/20 1837 132/81     Pulse Rate 02/20/20 1837 69     Resp --      Temp 02/20/20 1837 98.6 F (37 C)     Temp Source 02/20/20 1837 Oral     SpO2 02/20/20 1837 100 %     Weight 02/20/20 1833 190 lb (86.2 kg)     Height 02/20/20 1833 5' 3.5" (1.613 m)     Head Circumference --      Peak Flow --      Pain Score 02/20/20 1833 3     Pain Loc --      Pain Edu? --      Excl. in GC? --    Updated Vital Signs BP 132/81 (BP Location: Left Arm)   Pulse 69   Temp 98.6 F (37 C) (Oral)   Ht 5' 3.5" (1.613 m)   Wt 86.2 kg   LMP 02/19/2020 (Approximate)   SpO2 100%   BMI 33.13 kg/m   Visual Acuity Right Eye Distance:   Left Eye Distance:   Bilateral Distance:    Right Eye Near:   Left Eye Near:    Bilateral Near:     Physical Exam Vitals and nursing note reviewed.  Constitutional:      General: She is not in acute distress.    Appearance: Normal appearance. She is not ill-appearing.  HENT:     Head: Normocephalic and atraumatic.  Eyes:     General:        Right eye: No discharge.        Left eye: No discharge.     Conjunctiva/sclera: Conjunctivae normal.  Pulmonary:     Effort: Pulmonary effort is normal.  No respiratory distress.  Musculoskeletal:     Comments: Right ankle -exquisite tenderness over the lateral  malleolus.  Mild swelling noted.  Decreased range of motion secondary to pain.  Right foot -no discrete areas of tenderness.  Neurological:     Mental Status: She is alert.  Psychiatric:        Mood and Affect: Mood normal.        Behavior: Behavior normal.    UC Treatments / Results  Labs (all labs ordered are listed, but only abnormal results are displayed) Labs Reviewed - No data to display  EKG   Radiology DG Ankle Complete Right  Result Date: 02/20/2020 CLINICAL DATA:  Slip on wet plank leading to fall injuring right ankle. Heard a pop. EXAM: RIGHT ANKLE - COMPLETE 3+ VIEW COMPARISON:  None. FINDINGS: There is no evidence of fracture, dislocation, or joint effusion. The alignment and ankle mortise are preserved. There is moderate plantar calcaneal spur. Lateral soft tissue edema. IMPRESSION: 1. Lateral soft tissue edema without acute fracture or dislocation. 2. Plantar calcaneal spur. Electronically Signed   By: Narda Rutherford M.D.   On: 02/20/2020 19:06    Procedures Procedures (including critical care time)  Medications Ordered in UC Medications - No data to display  Initial Impression / Assessment and Plan / UC Course  I have reviewed the triage vital signs and the nursing notes.  Pertinent labs & imaging results that were available during my care of the patient were reviewed by me and considered in my medical decision making (see chart for details).    39 year old female presents with an ankle sprain.  X-ray negative.  Meloxicam as directed.  Placed in cam walker for comfort.  Work note given.  Final Clinical Impressions(s) / UC Diagnoses   Final diagnoses:  Acute right ankle pain   Discharge Instructions   None    ED Prescriptions    Medication Sig Dispense Auth. Provider   meloxicam (MOBIC) 15 MG tablet Take 1 tablet (15 mg total) by mouth daily for 7 days. 7 tablet Tommie Sams, DO     PDMP not reviewed this encounter.   Tommie Sams, [place] 02/20/20  1952

## 2020-04-22 DIAGNOSIS — M25571 Pain in right ankle and joints of right foot: Secondary | ICD-10-CM | POA: Diagnosis not present

## 2020-05-06 DIAGNOSIS — M25571 Pain in right ankle and joints of right foot: Secondary | ICD-10-CM | POA: Diagnosis not present

## 2020-05-06 DIAGNOSIS — M25671 Stiffness of right ankle, not elsewhere classified: Secondary | ICD-10-CM | POA: Diagnosis not present

## 2020-05-14 DIAGNOSIS — M25671 Stiffness of right ankle, not elsewhere classified: Secondary | ICD-10-CM | POA: Diagnosis not present

## 2020-05-14 DIAGNOSIS — M25571 Pain in right ankle and joints of right foot: Secondary | ICD-10-CM | POA: Diagnosis not present

## 2020-05-22 DIAGNOSIS — M25671 Stiffness of right ankle, not elsewhere classified: Secondary | ICD-10-CM | POA: Diagnosis not present

## 2020-05-22 DIAGNOSIS — M25571 Pain in right ankle and joints of right foot: Secondary | ICD-10-CM | POA: Diagnosis not present

## 2020-05-26 DIAGNOSIS — M25671 Stiffness of right ankle, not elsewhere classified: Secondary | ICD-10-CM | POA: Diagnosis not present

## 2020-05-26 DIAGNOSIS — M25571 Pain in right ankle and joints of right foot: Secondary | ICD-10-CM | POA: Diagnosis not present

## 2020-05-30 DIAGNOSIS — M25571 Pain in right ankle and joints of right foot: Secondary | ICD-10-CM | POA: Diagnosis not present

## 2020-05-30 DIAGNOSIS — M25561 Pain in right knee: Secondary | ICD-10-CM | POA: Diagnosis not present

## 2020-06-05 DIAGNOSIS — M25561 Pain in right knee: Secondary | ICD-10-CM | POA: Diagnosis not present

## 2020-06-10 DIAGNOSIS — M25561 Pain in right knee: Secondary | ICD-10-CM | POA: Diagnosis not present

## 2020-06-16 DIAGNOSIS — M25561 Pain in right knee: Secondary | ICD-10-CM | POA: Diagnosis not present

## 2020-06-25 DIAGNOSIS — M25561 Pain in right knee: Secondary | ICD-10-CM | POA: Diagnosis not present

## 2020-07-12 DIAGNOSIS — Z23 Encounter for immunization: Secondary | ICD-10-CM | POA: Diagnosis not present

## 2020-07-12 DIAGNOSIS — Z0000 Encounter for general adult medical examination without abnormal findings: Secondary | ICD-10-CM | POA: Diagnosis not present

## 2020-07-16 DIAGNOSIS — M25561 Pain in right knee: Secondary | ICD-10-CM | POA: Diagnosis not present

## 2020-07-23 DIAGNOSIS — M25561 Pain in right knee: Secondary | ICD-10-CM | POA: Diagnosis not present

## 2020-07-29 DIAGNOSIS — Z0184 Encounter for antibody response examination: Secondary | ICD-10-CM | POA: Diagnosis not present

## 2020-08-04 ENCOUNTER — Ambulatory Visit
Admission: RE | Admit: 2020-08-04 | Discharge: 2020-08-04 | Disposition: A | Discharge status: Home / Self Care | Payer: 59 | Attending: Family Medicine | Admitting: Family Medicine

## 2020-08-04 ENCOUNTER — Ambulatory Visit
Admission: RE | Admit: 2020-08-04 | Discharge: 2020-08-04 | Disposition: A | Discharge status: Home / Self Care | Payer: 59 | Source: Ambulatory Visit | Attending: Family Medicine | Admitting: Family Medicine

## 2020-08-04 ENCOUNTER — Other Ambulatory Visit: Payer: Self-pay

## 2020-08-04 ENCOUNTER — Ambulatory Visit (LOCAL_COMMUNITY_HEALTH_CENTER): Payer: Self-pay

## 2020-08-04 ENCOUNTER — Other Ambulatory Visit: Payer: Self-pay | Admitting: Family Medicine

## 2020-08-04 VITALS — Wt 194.0 lb

## 2020-08-04 DIAGNOSIS — R7612 Nonspecific reaction to cell mediated immunity measurement of gamma interferon antigen response without active tuberculosis: Secondary | ICD-10-CM

## 2020-08-04 DIAGNOSIS — Z111 Encounter for screening for respiratory tuberculosis: Secondary | ICD-10-CM

## 2020-08-04 NOTE — Progress Notes (Signed)
Patient in clinic today reporting a recent +QFT from [employer] employee clinic.  States 4 weeks ago had a 15mm/negative PPD when had skin test placed for school.   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    NO PCP Discussed LTBI vs Active TB and LTBI tx. Patient declines tx at this time.  Will send for CXR and provide employer letter to patient after CXR results. Richmond Campbell, RN

## 2020-08-05 ENCOUNTER — Telehealth: Payer: Self-pay

## 2020-08-05 NOTE — Telephone Encounter (Signed)
TC with patient. Discussed normal CXR and employer letter. Education administrator via [employer] ([employer]). If patient decided to proceed with LTBI tx, will call TB RN Richmond Campbell, RN

## 2020-08-26 ENCOUNTER — Other Ambulatory Visit: Payer: Self-pay | Admitting: Obstetrics & Gynecology

## 2020-08-26 DIAGNOSIS — Z3041 Encounter for surveillance of contraceptive pills: Secondary | ICD-10-CM

## 2020-10-01 ENCOUNTER — Ambulatory Visit (INDEPENDENT_AMBULATORY_CARE_PROVIDER_SITE_OTHER): Payer: 59 | Admitting: Advanced Practice Midwife

## 2020-10-01 ENCOUNTER — Other Ambulatory Visit: Payer: Self-pay

## 2020-10-01 ENCOUNTER — Encounter: Payer: Self-pay | Admitting: Advanced Practice Midwife

## 2020-10-01 VITALS — BP 118/74 | Ht 63.5 in | Wt 198.0 lb

## 2020-10-01 DIAGNOSIS — Z0000 Encounter for general adult medical examination without abnormal findings: Secondary | ICD-10-CM

## 2020-10-01 DIAGNOSIS — Z3041 Encounter for surveillance of contraceptive pills: Secondary | ICD-10-CM

## 2020-10-01 MED ORDER — NORGESTIM-ETH ESTRAD TRIPHASIC 0.18/0.215/0.25 MG-35 MCG PO TABS: 1.0000 | ORAL_TABLET | Freq: Every day | ORAL | 4 refills | Status: DC

## 2020-10-01 NOTE — Patient Instructions (Signed)
Managing Anxiety, Adult After being diagnosed with an anxiety disorder, you may be relieved to know why you have felt or behaved a certain way. You may also feel overwhelmed about the treatment ahead and what it will mean for your life. With care and support, you can manage this condition and recover from it. How to manage lifestyle changes Managing stress and anxiety  Stress is your body's reaction to life changes and events, both good and bad. Most stress will last just a few hours, but stress can be ongoing and can lead to more than just stress. Although stress can play a major role in anxiety, it is not the same as anxiety. Stress is usually caused by something external, such as a deadline, test, or competition. Stress normally passes after the triggering event has ended.  Anxiety is caused by something internal, such as imagining a terrible outcome or worrying that something will go wrong that will devastate you. Anxiety often does not go away even after the triggering event is over, and it can become long-term (chronic) worry. It is important to understand the differences between stress and anxiety and to manage your stress effectively so that it does not lead to an anxious response. Talk with your health care provider or a counselor to learn more about reducing anxiety and stress. He or she may suggest tension reduction techniques, such as:  Music therapy. This can include creating or listening to music that you enjoy and that inspires you.  Mindfulness-based meditation. This involves being aware of your normal breaths while not trying to control your breathing. It can be done while sitting or walking.  Centering prayer. This involves focusing on a word, phrase, or sacred image that means something to you and brings you peace.  Deep breathing. To do this, expand your stomach and inhale slowly through your nose. Hold your breath for 3-5 seconds. Then exhale slowly, letting your stomach muscles  relax.  Self-talk. This involves identifying thought patterns that lead to anxiety reactions and changing those patterns.  Muscle relaxation. This involves tensing muscles and then relaxing them. Choose a tension reduction technique that suits your lifestyle and personality. These techniques take time and practice. Set aside 5-15 minutes a day to do them. Therapists can offer counseling and training in these techniques. The training to help with anxiety may be covered by some insurance plans. Other things you can do to manage stress and anxiety include:  Keeping a stress/anxiety diary. This can help you learn what triggers your reaction and then learn ways to manage your response.  Thinking about how you react to certain situations. You may not be able to control everything, but you can control your response.  Making time for activities that help you relax and not feeling guilty about spending your time in this way.  Visual imagery and yoga can help you stay calm and relax.  Medicines Medicines can help ease symptoms. Medicines for anxiety include:  Anti-anxiety drugs.  Antidepressants. Medicines are often used as a primary treatment for anxiety disorder. Medicines will be prescribed by a health care provider. When used together, medicines, psychotherapy, and tension reduction techniques may be the most effective treatment. Relationships Relationships can play a big part in helping you recover. Try to spend more time connecting with trusted friends and family members. Consider going to couples counseling, taking family education classes, or going to family therapy. Therapy can help you and others better understand your condition. How to recognize changes in your   anxiety Everyone responds differently to treatment for anxiety. Recovery from anxiety happens when symptoms decrease and stop interfering with your daily activities at home or work. This may mean that you will start to:  Have  better concentration and focus. Worry will interfere less in your daily thinking.  Sleep better.  Be less irritable.  Have more energy.  Have improved memory. It is important to recognize when your condition is getting worse. Contact your health care provider if your symptoms interfere with home or work and you feel like your condition is not improving. Follow these instructions at home: Activity  Exercise. Most adults should do the following: ? Exercise for at least 150 minutes each week. The exercise should increase your heart rate and make you sweat (moderate-intensity exercise). ? Strengthening exercises at least twice a week.  Get the right amount and quality of sleep. Most adults need 7-9 hours of sleep each night. Lifestyle   Eat a healthy diet that includes plenty of vegetables, fruits, whole grains, low-fat dairy products, and lean protein. Do not eat a lot of foods that are high in solid fats, added sugars, or salt.  Make choices that simplify your life.  Do not use any products that contain nicotine or tobacco, such as cigarettes, e-cigarettes, and chewing tobacco. If you need help quitting, ask your health care provider.  Avoid caffeine, alcohol, and certain over-the-counter cold medicines. These may make you feel worse. Ask your pharmacist which medicines to avoid. General instructions  Take over-the-counter and prescription medicines only as told by your health care provider.  Keep all follow-up visits as told by your health care provider. This is important. Where to find support You can get help and support from these sources:  Self-help groups.  Online and community organizations.  A trusted spiritual leader.  Couples counseling.  Family education classes.  Family therapy. Where to find more information You may find that joining a support group helps you deal with your anxiety. The following sources can help you locate counselors or support groups near  you:  Mental Health America: www.mentalhealthamerica.net  Anxiety and Depression Association of America (ADAA): www.adaa.org  National Alliance on Mental Illness (NAMI): www.nami.org Contact a health care provider if you:  Have a hard time staying focused or finishing daily tasks.  Spend many hours a day feeling worried about everyday life.  Become exhausted by worry.  Start to have headaches, feel tense, or have nausea.  Urinate more than normal.  Have diarrhea. Get help right away if you have:  A racing heart and shortness of breath.  Thoughts of hurting yourself or others. If you ever feel like you may hurt yourself or others, or have thoughts about taking your own life, get help right away. You can go to your nearest emergency department or call:  Your local emergency services (911 in the [place]  A suicide crisis helpline, such as the National Suicide Prevention Lifeline at 1-800-273-8255. This is open 24 hours a day. Summary  Taking steps to learn and use tension reduction techniques can help calm you and help prevent triggering an anxiety reaction.  When used together, medicines, psychotherapy, and tension reduction techniques may be the most effective treatment.  Family, friends, and partners can play a big part in helping you recover from an anxiety disorder. This information is not intended to replace advice given to you by your health care provider. Make sure you discuss any questions you have with your health care provider. Document Revised:   04/24/2019 Document Reviewed: 04/24/2019 Elsevier Patient Education  2020 [employer]. American Heart Association [facility]) Exercise Recommendation  Being physically active is important to prevent heart disease and stroke, the nation's No. 1and No. 5killers. To improve overall cardiovascular health, we suggest at least 150 minutes per week of moderate exercise or 75 minutes per week of vigorous exercise (or a combination of  moderate and vigorous activity). Thirty minutes a day, five times a week is an easy goal to remember. You will also experience benefits even if you divide your time into two or three segments of 10 to 15 minutes per day.  For people who would benefit from lowering their blood pressure or cholesterol, we recommend 40 minutes of aerobic exercise of moderate to vigorous intensity three to four times a week to lower the risk for heart attack and stroke.  Physical activity is anything that makes you move your body and burn calories.  This includes things like climbing stairs or playing sports. Aerobic exercises benefit your heart, and include walking, jogging, swimming or biking. Strength and stretching exercises are best for overall stamina and flexibility.  The simplest, positive change you can make to effectively improve your heart health is to start walking. It's enjoyable, free, easy, social and great exercise. A walking program is flexible and boasts high success rates because people can stick with it. It's easy for walking to become a regular and satisfying part of life.   For Overall Cardiovascular Health:  At least 30 minutes of moderate-intensity aerobic activity at least 5 days per week for a total of 150  OR   At least 25 minutes of vigorous aerobic activity at least 3 days per week for a total of 75 minutes; or a combination of moderate- and vigorous-intensity aerobic activity  AND   Moderate- to high-intensity muscle-strengthening activity at least 2 days per week for additional health benefits.  For Lowering Blood Pressure and Cholesterol  An average 40 minutes of moderate- to vigorous-intensity aerobic activity 3 or 4 times per week  What if I can't make it to the time goal? Something is always better than nothing! And everyone has to start somewhere. Even if you've been sedentary for years, today is the day you can begin to make healthy changes in your life. If you don't  think you'll make it for 30 or 40 minutes, set a reachable goal for today. You can work up toward your overall goal by increasing your time as you get stronger. Don't let all-or-nothing thinking rob you of doing what you can every day.  Source:http://www.heart.org   Mediterranean Diet A Mediterranean diet refers to food and lifestyle choices that are based on the traditions of countries located on the [place]. This way of eating has been shown to help prevent certain conditions and improve outcomes for people who have chronic diseases, like kidney disease and heart disease. What are tips for following this plan? Lifestyle  Cook and eat meals together with your family, when possible.  Drink enough fluid to keep your urine clear or pale yellow.  Be physically active every day. This includes: ? Aerobic exercise like running or swimming. ? Leisure activities like gardening, walking, or housework.  Get 7-8 hours of sleep each night.  If recommended by your health care provider, drink red wine in moderation. This means 1 glass a day for nonpregnant women and 2 glasses a day for men. A glass of wine equals 5 oz (150 mL). Reading food labels  Check the serving size of packaged foods. For foods such as rice and pasta, the serving size refers to the amount of cooked product, not dry.  Check the total fat in packaged foods. Avoid foods that have saturated fat or trans fats.  Check the ingredients list for added sugars, such as corn syrup. Shopping  At the grocery store, buy most of your food from the areas near the walls of the store. This includes: ? Fresh fruits and vegetables (produce). ? Grains, beans, nuts, and seeds. Some of these may be available in unpackaged forms or large amounts (in bulk). ? Fresh seafood. ? Poultry and eggs. ? Low-fat dairy products.  Buy whole ingredients instead of prepackaged foods.  Buy fresh fruits and vegetables in-season from local farmers  markets.  Buy frozen fruits and vegetables in resealable bags.  If you do not have access to quality fresh seafood, buy precooked frozen shrimp or canned fish, such as tuna, salmon, or sardines.  Buy small amounts of raw or cooked vegetables, salads, or olives from the deli or salad bar at your store.  Stock your pantry so you always have certain foods on hand, such as olive oil, canned tuna, canned tomatoes, rice, pasta, and beans. Cooking  Cook foods with extra-virgin olive oil instead of using butter or other vegetable oils.  Have meat as a side dish, and have vegetables or grains as your main dish. This means having meat in small portions or adding small amounts of meat to foods like pasta or stew.  Use beans or vegetables instead of meat in common dishes like chili or lasagna.  Experiment with different cooking methods. Try roasting or broiling vegetables instead of steaming or sauteing them.  Add frozen vegetables to soups, stews, pasta, or rice.  Add nuts or seeds for added healthy fat at each meal. You can add these to yogurt, salads, or vegetable dishes.  Marinate fish or vegetables using olive oil, lemon juice, garlic, and fresh herbs. Meal planning   Plan to eat 1 vegetarian meal one day each week. Try to work up to 2 vegetarian meals, if possible.  Eat seafood 2 or more times a week.  Have healthy snacks readily available, such as: ? Vegetable sticks with hummus. ? [place] yogurt. ? Fruit and nut trail mix.  Eat balanced meals throughout the week. This includes: ? Fruit: 2-3 servings a day ? Vegetables: 4-5 servings a day ? Low-fat dairy: 2 servings a day ? Fish, poultry, or lean meat: 1 serving a day ? Beans and legumes: 2 or more servings a week ? Nuts and seeds: 1-2 servings a day ? Whole grains: 6-8 servings a day ? Extra-virgin olive oil: 3-4 servings a day  Limit red meat and sweets to only a few servings a month What are my food  choices?  Mediterranean diet ? Recommended  Grains: Whole-grain pasta. Brown rice. Bulgar wheat. Polenta. Couscous. Whole-wheat bread. Orpah Cobb.  Vegetables: Artichokes. Beets. Broccoli. Cabbage. Carrots. Eggplant. Green beans. Chard. Kale. Spinach. Onions. Leeks. Peas. Squash. Tomatoes. Peppers. Radishes.  Fruits: Apples. Apricots. Avocado. Berries. Bananas. Cherries. Dates. Figs. Grapes. Lemons. Melon. Oranges. Peaches. Plums. Pomegranate.  Meats and other protein foods: Beans. Almonds. Sunflower seeds. Pine nuts. Peanuts. Cod. Salmon. Scallops. Shrimp. Tuna. Tilapia. Clams. Oysters. Eggs.  Dairy: Low-fat milk. Cheese. Greek yogurt.  Beverages: Water. Red wine. Herbal tea.  Fats and oils: Extra virgin olive oil. Avocado oil. Grape seed oil.  Sweets and desserts: [place] yogurt with honey. Baked  apples. Poached pears. Trail mix.  Seasoning and other foods: Basil. Cilantro. Coriander. Cumin. Mint. Parsley. Sage. Rosemary. Tarragon. Garlic. Oregano. Thyme. Pepper. Balsalmic vinegar. Tahini. Hummus. Tomato sauce. Olives. Mushrooms. ? Limit these  Grains: Prepackaged pasta or rice dishes. Prepackaged cereal with added sugar.  Vegetables: Deep fried potatoes (french fries).  Fruits: Fruit canned in syrup.  Meats and other protein foods: Beef. Pork. Lamb. Poultry with skin. Hot dogs. Tomasa Blase.  Dairy: Ice cream. Sour cream. Whole milk.  Beverages: Juice. Sugar-sweetened soft drinks. Beer. Liquor and spirits.  Fats and oils: Butter. Canola oil. Vegetable oil. Beef fat (tallow). Lard.  Sweets and desserts: Cookies. Cakes. Pies. Candy.  Seasoning and other foods: Mayonnaise. Premade sauces and marinades. The items listed may not be a complete list. Talk with your dietitian about what dietary choices are right for you. Summary  The Mediterranean diet includes both food and lifestyle choices.  Eat a variety of fresh fruits and vegetables, beans, nuts, seeds, and whole  grains.  Limit the amount of red meat and sweets that you eat.  Talk with your health care provider about whether it is safe for you to drink red wine in moderation. This means 1 glass a day for nonpregnant women and 2 glasses a day for men. A glass of wine equals 5 oz (150 mL). This information is not intended to replace advice given to you by your health care provider. Make sure you discuss any questions you have with your health care provider. Document Revised: 07/22/2016 Document Reviewed: 07/15/2016 Elsevier Patient Education  2020 [employer].

## 2020-10-01 NOTE — Progress Notes (Signed)
Gynecology Annual Exam   PCP: [facility], [place]  Chief Complaint:  Chief Complaint  Patient presents with  . Annual Exam    History of Present Illness: Patient is a 39 y.o. Pamela Hamilton presents for annual exam. The patient has no complaints today.   LMP: Patient's last menstrual period was 09/05/2020. Average Interval: regular, 28 days Duration of flow: 3-5 days Heavy Menses: sometimes Clots: no Intermenstrual Bleeding: no Postcoital Bleeding: no Dysmenorrhea: no  The patient is sexually active. She currently uses OCP (estrogen/progesterone) for contraception. She denies dyspareunia.  The patient does perform self breast exams.  There is unknown notable family history of breast or ovarian cancer in her family. She has a maternal aunt and paternal aunt who had breast cancer. She does not know at what age they were diagnosed.  The patient wears seatbelts: yes.   The patient has regular exercise: she is active and sometimes hikes, bikes, on her feet all day at work. She likes yoga and walking and thinks the issue is the frequency/consistency of exercising. She tries to eat a healthy diet and stay hydrated. She admits about 6-7 hours sleep per night.    The patient denies current symptoms of depression. She admits ongoing anxiety and copes with relaxation techniques. She considers going to a therapist.   Review of Systems: Review of Systems  Constitutional: Positive for malaise/fatigue. Negative for chills and fever.  HENT: Positive for congestion. Negative for ear discharge, ear pain, hearing loss, sinus pain and sore throat.   Eyes: Positive for redness. Negative for blurred vision and double vision.  Respiratory: Positive for wheezing. Negative for cough and shortness of breath.   Cardiovascular: Negative for chest pain, palpitations and leg swelling.  Gastrointestinal: Negative for abdominal pain, blood in stool, constipation, diarrhea, heartburn, melena, nausea and  vomiting.  Genitourinary: Negative for dysuria, flank pain, frequency, hematuria and urgency.  Musculoskeletal: Positive for joint pain. Negative for back pain and myalgias.  Skin: Negative for itching and rash.  Neurological: Positive for headaches. Negative for dizziness, tingling, tremors, sensory change, speech change, focal weakness, seizures, loss of consciousness and weakness.  Endo/Heme/Allergies: Negative for environmental allergies. Does not bruise/bleed easily.  Psychiatric/Behavioral: Negative for depression, hallucinations, memory loss, substance abuse and suicidal ideas. The patient is not nervous/anxious and does not have insomnia.        Positive for anxiety    Past Medical History:  Patient Active Problem List   Diagnosis Date Noted  . Preoperative clearance 04/27/2018  . Family history of premature coronary artery disease 04/27/2018  . Mixed hyperlipidemia 04/27/2018    Past Surgical History:  Past Surgical History:  Procedure Laterality Date  . BREAST CYST ASPIRATION Right 2008  . BREAST CYST ASPIRATION Right 2009  . DILATION AND CURETTAGE OF UTERUS    . EYE SURGERY     X 3  . SHOULDER ARTHROSCOPY Right   . TONSILLECTOMY N/A 07/13/2018   Procedure: TONSILLECTOMY;  Surgeon: Bud Face, MD;  Location: [facility] ORS;  Service: ENT;  Laterality: N/A;  . TONSILLECTOMY    . WISDOM TOOTH EXTRACTION      Gynecologic History:  Patient's last menstrual period was 09/05/2020. Contraception: OCP (estrogen/progesterone) Last Pap: 2 years ago Results were: no abnormalities   Obstetric History: [id number]  Family History:  Family History  Problem Relation Age of Onset  . Hypertension Mother   . Hyperlipidemia Mother   . Diabetes Mother   . Heart disease Father   .  CAD Father   . Hyperlipidemia Father   . Diabetes Maternal Grandmother   . Hypertension Maternal Grandmother   . Heart disease Paternal Grandmother   . Heart disease Paternal Grandfather   . Breast  cancer Maternal Aunt 43  . Heart disease Other   . Breast cancer Cousin 35  . Breast cancer Other   . Breast cancer Other     Social History:  Social History   Socioeconomic History  . Marital status: Married    Spouse name: Not on file  . Number of children: Not on file  . Years of education: Not on file  . Highest education level: Not on file  Occupational History  . Not on file  Tobacco Use  . Smoking status: Never Smoker  . Smokeless tobacco: Never Used  Vaping Use  . Vaping Use: Never used  Substance and Sexual Activity  . Alcohol use: Yes    Comment: OCC  . Drug use: No  . Sexual activity: Yes    Birth control/protection: Pill  Other Topics Concern  . Not on file  Social History Narrative  . Not on file   Social Determinants of Health   Financial Resource Strain:   . Difficulty of Paying Living Expenses: Not on file  Food Insecurity:   . Worried About Programme researcher, broadcasting/film/video in the Last Year: Not on file  . Ran Out of Food in the Last Year: Not on file  Transportation Needs:   . Lack of Transportation (Medical): Not on file  . Lack of Transportation (Non-Medical): Not on file  Physical Activity:   . Days of Exercise per Week: Not on file  . Minutes of Exercise per Session: Not on file  Stress:   . Feeling of Stress : Not on file  Social Connections:   . Frequency of Communication with Friends and Family: Not on file  . Frequency of Social Gatherings with Friends and Family: Not on file  . Attends Religious Services: Not on file  . Active Member of Clubs or Organizations: Not on file  . Attends Banker Meetings: Not on file  . Marital Status: Not on file  Intimate Partner Violence:   . Fear of Current or Ex-Partner: Not on file  . Emotionally Abused: Not on file  . Physically Abused: Not on file  . Sexually Abused: Not on file    Allergies:  Allergies  Allergen Reactions  . Versed [Midazolam]     PT STATES SHE BECOMES VERY DISORIENTED  AND LOOPY    Medications: Prior to Admission medications   Medication Sig Start Date End Date Taking? Authorizing Provider  acetaminophen (TYLENOL) 500 MG tablet Take 1,000 mg by mouth 2 (two) times daily as needed for moderate pain or headache.   Yes [provider]  albuterol (PROVENTIL HFA) 108 (90 Base) MCG/ACT inhaler Inhale 2 puffs into the lungs every 6 (six) hours as needed for wheezing or shortness of breath.  10/18/17  Yes [provider]  fluticasone (FLONASE) 50 MCG/ACT nasal spray Place 2 sprays into both nostrils daily. 10/30/19  Yes Hedges, Tinnie Gens, PA-C  Hypromellose (ARTIFICIAL TEARS OP) Place 1 drop into both eyes daily as needed (dry eyes).   Yes [provider]  loratadine (CLARITIN) 10 MG tablet Take 10 mg by mouth daily as needed for allergies.   Yes [provider]  Menthol, Topical Analgesic, (ICY HOT EX) Apply 1 application topically daily as needed (pain).   Yes [provider]  Multiple Vitamin (MULTI-VITAMINS) TABS Take 1 tablet by mouth every evening.    Yes [provider]  Norgestimate-Ethinyl Estradiol Triphasic (TRI-SPRINTEC) 0.18/0.215/0.25 MG-35 MCG tablet Take 1 tablet by mouth daily. 10/01/20  Yes Tresea Mall, CNM  Omega-3 Fatty Acids (FISH OIL ULTRA) 1400 MG CAPS Take 1,400 mg by mouth every evening.   Yes [provider]  sodium chloride (OCEAN) 0.65 % SOLN nasal spray Place 1 spray into both nostrils as needed for congestion.   Yes [provider]  vitamin C (ASCORBIC ACID) 500 MG tablet Take 500 mg by mouth every evening.   Yes [provider]    Physical Exam Vitals: Blood pressure 118/74, height 5' 3.5" (1.613 m), weight 198 lb (89.8 kg), last menstrual period 09/05/2020.  General: NAD HEENT: normocephalic, anicteric Thyroid: no enlargement, no palpable nodules Pulmonary: No increased work of breathing, CTAB Cardiovascular: RRR, distal pulses 2+ Breast: Breast  symmetrical, no tenderness, no palpable nodules or masses, no skin or nipple retraction present, no nipple discharge.  No axillary or supraclavicular lymphadenopathy. Abdomen: NABS, soft, non-tender, non-distended.  Umbilicus without lesions.  No hepatomegaly, splenomegaly or masses palpable. No evidence of hernia  Genitourinary: deferred for no concerns/PAP interval Extremities: no edema, erythema, or tenderness Neurologic: Grossly intact Psychiatric: mood appropriate, affect full    Assessment: 39 y.o. [id number] routine annual exam  Plan: Problem List Items Addressed This Visit    None    Visit Diagnoses    Well woman exam without gynecological exam    -  Primary   Encounter for surveillance of contraceptive pills       Relevant Medications   Norgestimate-Ethinyl Estradiol Triphasic (TRI-SPRINTEC) 0.18/0.215/0.25 MG-35 MCG tablet      1) STI screening  was offered and declined  2)  ASCCP guidelines and rationale discussed.  Patient opts for every 3 years screening interval  3) Contraception - the patient is currently using  OCP (estrogen/progesterone).  She is happy with her current form of contraception and plans to continue  4) Routine healthcare maintenance including cholesterol, diabetes screening discussed Declines  5) Return in about 1 year (around 10/01/2021) for annual established gyn.   Tresea Mall, CNM [facility] 10/01/2020, 11:07 AM

## 2020-11-22 IMAGING — CR DG CHEST 1V
1 series · 1 of 1 positions shown · non-contrast
Comparison: None.

CLINICAL DATA: Positive QuantiFERON test.

EXAM:
CHEST  1 VIEW

[w chest pa]
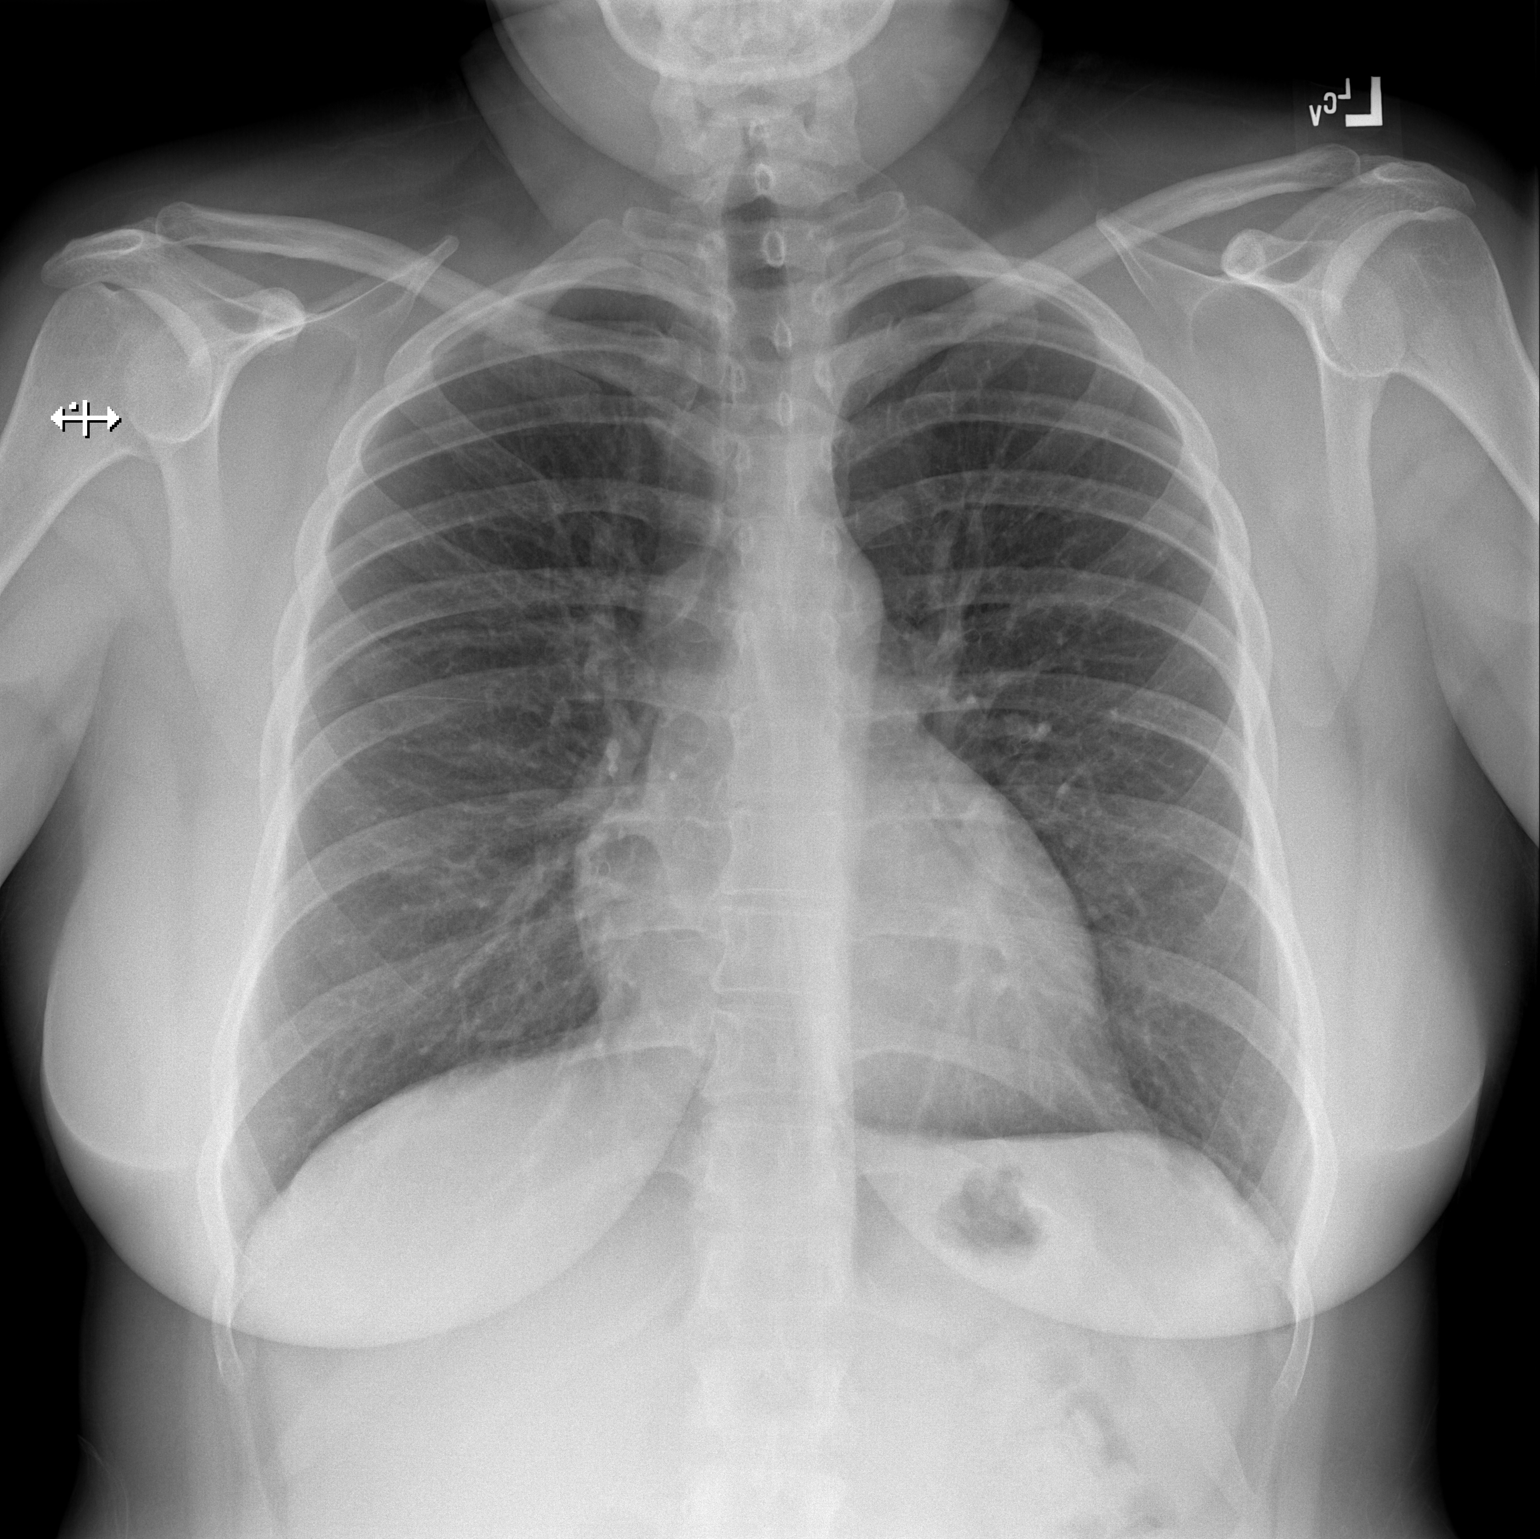

[1 of 1 positions shown; findings below may reference images not displayed]

FINDINGS: Lungs clear. Heart size normal. No pneumothorax or pleural fluid. No
bony abnormality.
IMPRESSION: Normal chest.

## 2020-12-09 ENCOUNTER — Other Ambulatory Visit: Payer: Self-pay

## 2020-12-09 ENCOUNTER — Ambulatory Visit
Admission: EM | Admit: 2020-12-09 | Discharge: 2020-12-09 | Disposition: A | Discharge status: Home / Self Care | Payer: 59 | Attending: Family Medicine | Admitting: Family Medicine

## 2020-12-09 DIAGNOSIS — Z7689 Persons encountering health services in other specified circumstances: Secondary | ICD-10-CM | POA: Diagnosis not present

## 2020-12-09 DIAGNOSIS — J0110 Acute frontal sinusitis, unspecified: Secondary | ICD-10-CM | POA: Diagnosis not present

## 2020-12-09 MED ORDER — PREDNISONE 10 MG PO TABS: 40.0000 mg | ORAL_TABLET | Freq: Every day | ORAL | 0 refills | Status: AC

## 2020-12-09 NOTE — ED Triage Notes (Signed)
Cough, congestion, sinus pressure, SOB x 2 days.

## 2020-12-09 NOTE — Discharge Instructions (Signed)
Recommend daily antihistamine and Flonase as prescribed. Albuterol as needed. Mucinex for cough, congestion Prednisone as prescribed

## 2020-12-10 LAB — NOVEL CORONAVIRUS, NAA: SARS-CoV-2, NAA: NOT DETECTED

## 2020-12-10 LAB — SARS-COV-2, NAA 2 DAY TAT

## 2020-12-11 ENCOUNTER — Telehealth: Payer: Self-pay | Admitting: Family Medicine

## 2020-12-11 MED ORDER — AMOXICILLIN-POT CLAVULANATE 875-125 MG PO TABS: 1.0000 | ORAL_TABLET | Freq: Two times a day (BID) | ORAL | 0 refills | Status: DC

## 2020-12-11 NOTE — ED Provider Notes (Signed)
Pamela Hamilton    CSN: [id number] Arrival date & time: 12/09/20  1308      History   Chief Complaint Chief Complaint  Patient presents with  . Cough    [phone]    HPI Pamela Hamilton is a 40 y.o. female.   Patient is a 40 year old female presents today for cough, congestion, sinus pressure for the past couple days.  Mild shortness of breath.  Symptoms have been constant.  Taking over-the-counter medicines without much relief.  No fevers, chills.     Past Medical History:  Diagnosis Date  . Abortion, missed   . Anemia   . Arthritis   . Asthma    EXERCISE INDUCED  . Family history of breast cancer    genetic testing letter sent 3/19, 7/20  . GERD (gastroesophageal reflux disease)    OCC NO MEDS  . Headache    MIGRAINES  . History of methicillin resistant staphylococcus aureus (MRSA) 02/2018   NASAL SWAB   . History of strep sore throat   . Hypothyroidism     Patient Active Problem List   Diagnosis Date Noted  . Preoperative clearance 04/27/2018  . Family history of premature coronary artery disease 04/27/2018  . Mixed hyperlipidemia 04/27/2018    Past Surgical History:  Procedure Laterality Date  . BREAST CYST ASPIRATION Right 2008  . BREAST CYST ASPIRATION Right 2009  . DILATION AND CURETTAGE OF UTERUS    . EYE SURGERY     X 3  . SHOULDER ARTHROSCOPY Right   . TONSILLECTOMY N/A 07/13/2018   Procedure: TONSILLECTOMY;  Surgeon: Bud Face, MD;  Location: [facility] ORS;  Service: ENT;  Laterality: N/A;  . TONSILLECTOMY    . WISDOM TOOTH EXTRACTION      OB History    Gravida  3   Para  2   Term  2   Preterm      AB  1   Living  2     SAB  1   IAB      Ectopic      Multiple      Live Births  2            Home Medications    Prior to Admission medications   Medication Sig Start Date End Date Taking? Authorizing Provider  acetaminophen (TYLENOL) 500 MG tablet Take 1,000 mg by mouth 2 (two) times daily as needed  for moderate pain or headache.   Yes [provider]  albuterol (VENTOLIN HFA) 108 (90 Base) MCG/ACT inhaler Inhale 2 puffs into the lungs every 6 (six) hours as needed for wheezing or shortness of breath.  10/18/17  Yes [provider]  fluticasone (FLONASE) 50 MCG/ACT nasal spray Place 2 sprays into both nostrils daily. 10/30/19  Yes Hedges, Tinnie Gens, PA-C  Hypromellose (ARTIFICIAL TEARS OP) Place 1 drop into both eyes daily as needed (dry eyes).   Yes [provider]  Menthol, Topical Analgesic, (ICY HOT EX) Apply 1 application topically daily as needed (pain).   Yes [provider]  Multiple Vitamin (MULTI-VITAMINS) TABS Take 1 tablet by mouth every evening.    Yes [provider]  Norgestimate-Ethinyl Estradiol Triphasic (TRI-SPRINTEC) 0.18/0.215/0.25 MG-35 MCG tablet Take 1 tablet by mouth daily. 10/01/20  Yes Tresea Mall, CNM  Omega-3 Fatty Acids (FISH OIL ULTRA) 1400 MG CAPS Take 1,400 mg by mouth every evening.   Yes [provider]  predniSONE (DELTASONE) 10 MG tablet Take 4 tablets (40 mg total)  by mouth daily for 5 days. 12/09/20 12/14/20 Yes Danamarie Minami A, NP  sodium chloride (OCEAN) 0.65 % SOLN nasal spray Place 1 spray into both nostrils as needed for congestion.   Yes [provider]  vitamin C (ASCORBIC ACID) 500 MG tablet Take 500 mg by mouth every evening.   Yes [provider]  amoxicillin-clavulanate (AUGMENTIN) 875-125 MG tablet Take 1 tablet by mouth every 12 (twelve) hours. 12/11/20   Dahlia Byes A, NP  loratadine (CLARITIN) 10 MG tablet Take 10 mg by mouth daily as needed for allergies.  12/09/20  [provider]    Family History Family History  Problem Relation Age of Onset  . Hypertension Mother   . Hyperlipidemia Mother   . Diabetes Mother   . Heart disease Father   . CAD Father   . Hyperlipidemia Father   . Diabetes Maternal Grandmother   . Hypertension Maternal Grandmother   . Heart  disease Paternal Grandmother   . Heart disease Paternal Grandfather   . Breast cancer Maternal Aunt 43  . Heart disease Other   . Breast cancer Cousin 35  . Breast cancer Other   . Breast cancer Other     Social History Social History   Tobacco Use  . Smoking status: Never Smoker  . Smokeless tobacco: Never Used  Vaping Use  . Vaping Use: Never used  Substance Use Topics  . Alcohol use: Yes    Comment: OCC  . Drug use: No     Allergies   Versed [midazolam]   Review of Systems Review of Systems   Physical Exam Triage Vital Signs ED Triage Vitals  Enc Vitals Group     BP 12/09/20 1543 126/84     Pulse Rate 12/09/20 1543 67     Resp 12/09/20 1543 16     Temp 12/09/20 1543 98.7 F (37.1 C)     Temp Source 12/09/20 1543 Oral     SpO2 12/09/20 1543 98 %     Weight --      Height --      Head Circumference --      Peak Flow --      Pain Score 12/09/20 1456 4     Pain Loc --      Pain Edu? --      Excl. in GC? --    No data found.  Updated Vital Signs BP 126/84 (BP Location: Left Arm)   Pulse 67   Temp 98.7 F (37.1 C) (Oral)   Resp 16   LMP 11/29/2020   SpO2 98%   Visual Acuity Right Eye Distance:   Left Eye Distance:   Bilateral Distance:    Right Eye Near:   Left Eye Near:    Bilateral Near:     Physical Exam Vitals and nursing note reviewed.  Constitutional:      General: She is not in acute distress.    Appearance: Normal appearance. She is not ill-appearing, toxic-appearing or diaphoretic.  HENT:     Head: Normocephalic.     Right Ear: Tympanic membrane and ear canal normal.     Left Ear: Tympanic membrane and ear canal normal.     Nose: Congestion present.     Mouth/Throat:     Pharynx: Oropharynx is clear.  Eyes:     Conjunctiva/sclera: Conjunctivae normal.  Cardiovascular:     Rate and Rhythm: Normal rate and regular rhythm.  Pulmonary:     Effort: Pulmonary effort is normal.  Breath sounds: Normal breath sounds.   Musculoskeletal:        General: Normal range of motion.     Cervical back: Normal range of motion.  Skin:    General: Skin is warm and dry.     Findings: No rash.  Neurological:     Mental Status: She is alert.  Psychiatric:        Mood and Affect: Mood normal.      UC Treatments / Results  Labs (all labs ordered are listed, but only abnormal results are displayed) Labs Reviewed  NOVEL CORONAVIRUS, NAA   Narrative:    Performed at:  [place], [place], [place]  [id number] Lab Director: Katina Degree MDPhD, Phone:  [phone]  SARS-COV-2, NAA 2 DAY TAT   Narrative:    Performed at:  [facility] [address] [place], [place]  [id number] Lab Director: Rush Farmer MD, Phone:  [phone]    EKG   Radiology No results found.  Procedures Procedures (including critical care time)  Medications Ordered in UC Medications - No data to display  Initial Impression / Assessment and Plan / UC Course  I have reviewed the triage vital signs and the nursing notes.  Pertinent labs & imaging results that were available during my care of the patient were reviewed by me and considered in my medical decision making (see chart for details).     Acute sinusitis Most likely viral.  Recommended daily antihistamine and Flonase.  Albuterol as needed for cough, wheezing or shortness of breath.  Mucinex for cough and congestion. Prednisone as prescribed Follow up as needed for continued or worsening symptoms  Final Clinical Impressions(s) / UC Diagnoses   Final diagnoses:  Acute non-recurrent frontal sinusitis     Discharge Instructions     Recommend daily antihistamine and Flonase as prescribed. Albuterol as needed. Mucinex for cough, congestion Prednisone as prescribed    ED Prescriptions    Medication Sig Dispense Auth. Provider   predniSONE (DELTASONE) 10 MG tablet Take 4 tablets (40 mg total) by mouth daily for 5 days. 20 tablet  Loura Halt A, NP     PDMP not reviewed this encounter.   Orvan July, NP 12/11/20 1622

## 2020-12-11 NOTE — Telephone Encounter (Signed)
Treating for sinus infection  

## 2020-12-16 ENCOUNTER — Other Ambulatory Visit: Payer: Self-pay | Admitting: Internal Medicine

## 2020-12-16 DIAGNOSIS — R059 Cough, unspecified: Secondary | ICD-10-CM | POA: Diagnosis not present

## 2020-12-16 DIAGNOSIS — J209 Acute bronchitis, unspecified: Secondary | ICD-10-CM | POA: Diagnosis not present

## 2020-12-16 DIAGNOSIS — J019 Acute sinusitis, unspecified: Secondary | ICD-10-CM | POA: Diagnosis not present

## 2020-12-16 DIAGNOSIS — R5383 Other fatigue: Secondary | ICD-10-CM | POA: Diagnosis not present

## 2020-12-16 DIAGNOSIS — R0602 Shortness of breath: Secondary | ICD-10-CM | POA: Diagnosis not present

## 2020-12-16 DIAGNOSIS — R5381 Other malaise: Secondary | ICD-10-CM | POA: Diagnosis not present

## 2020-12-16 DIAGNOSIS — M542 Cervicalgia: Secondary | ICD-10-CM | POA: Diagnosis not present

## 2021-02-16 ENCOUNTER — Telehealth: Payer: 59 | Admitting: Physician Assistant

## 2021-02-16 DIAGNOSIS — U071 COVID-19: Secondary | ICD-10-CM

## 2021-02-16 DIAGNOSIS — J40 Bronchitis, not specified as acute or chronic: Secondary | ICD-10-CM

## 2021-02-16 MED ORDER — DOXYCYCLINE HYCLATE 100 MG PO TABS: 100.0000 mg | ORAL_TABLET | Freq: Two times a day (BID) | ORAL | 0 refills | Status: DC

## 2021-02-16 MED ORDER — BENZONATATE 100 MG PO CAPS
100.0000 mg | ORAL_CAPSULE | Freq: Three times a day (TID) | ORAL | 0 refills | Status: DC | PRN
Start: 2021-02-16 — End: 2021-03-23

## 2021-02-16 MED ORDER — PREDNISONE 20 MG PO TABS: 20.0000 mg | ORAL_TABLET | Freq: Every day | ORAL | 0 refills | Status: DC

## 2021-02-16 NOTE — Progress Notes (Signed)
Patient requested a callback as she had some follow-up questions. Discussed concerns in more detail. She does have history of reactive airway and noting some chest tightness along with current symptoms despite her albuterol inhaler. She is speaking in complete sentences without SOB. Discussed prednisone burst and Rx sent. Strict PCP follow-up, UC/ER precautions given.

## 2021-02-16 NOTE — Progress Notes (Signed)
I have spent 5 minutes in review of e-visit questionnaire, review and updating patient chart, medical decision making and response to patient.   Nesanel Aguila Cody Amrom Ore, PA-C    

## 2021-02-16 NOTE — Addendum Note (Signed)
Addended by: Waldon Merl on: 02/16/2021 10:58 AM   Modules accepted: Orders

## 2021-02-16 NOTE — Progress Notes (Signed)
E-Visit for Positive Covid Test Result We are sorry you are not feeling well. We are here to help!  You have tested positive for COVID-19, meaning that you were infected with the novel coronavirus and could give the virus to others.  It is vitally important that you stay home so you do not spread it to others.      Please continue isolation at home, for at least 10 days since the start of your symptoms and until you have had 24 hours with no fever (without taking a fever reducer) and with improving of symptoms.  If you have no symptoms but tested positive (or all symptoms resolve after 5 days and you have no fever) you can leave your house but continue to wear a mask around others for an additional 5 days. If you have a fever,continue to stay home until you have had 24 hours of no fever. Most cases improve 5-10 days from onset but we have seen a small number of patients who have gotten worse after the 10 days.  Please be sure to watch for worsening symptoms and remain taking the proper precautions.   Go to the nearest hospital ED for assessment if fever/cough/breathlessness are severe or illness seems like a threat to life.    The following symptoms may appear 2-14 days after exposure: . Fever . Cough . Shortness of breath or difficulty breathing . Chills . Repeated shaking with chills . Muscle pain . Headache . Sore throat . New loss of taste or smell . Fatigue . Congestion or runny nose . Nausea or vomiting . Diarrhea  You have been enrolled in [facility] Monitoring for COVID-19. Daily you will receive a questionnaire within the MyChart website. Our COVID-19 response team will be monitoring your responses daily.  You can use medication such as prescription cough medication called Tessalon Perles 100 mg. You may take 1-2 capsules every 8 hours as needed for cough. I am concerned for a secondary bronchitis so I am sending in an antibiotic called Doxycycline for you to take as  directed.  You have tested positive for Covid but because you are not considered high risk you do not qualify for monoclonal antibody infusion.  Supportive care is all that is needed.   You may also take acetaminophen (Tylenol) as needed for fever.  HOME CARE: . Only take medications as instructed by your medical team. . Drink plenty of fluids and get plenty of rest. . A steam or ultrasonic humidifier can help if you have congestion.   GET HELP RIGHT AWAY IF YOU HAVE EMERGENCY WARNING SIGNS.  Call 911 or proceed to your closest emergency facility if: . You develop worsening high fever. . Trouble breathing . Bluish lips or face . Persistent pain or pressure in the chest . New confusion . Inability to wake or stay awake . You cough up blood. . Your symptoms become more severe . Inability to hold down food or fluids  This list is not all possible symptoms. Contact your medical provider for any symptoms that are severe or concerning to you.    Your e-visit answers were reviewed by a board certified advanced clinical practitioner to complete your personal care plan.  Depending on the condition, your plan could have included both over the counter or prescription medications.  If there is a problem please reply once you have received a response from your provider.  Your safety is important to [place].  If you have drug allergies check your  prescription carefully.    You can use MyChart to ask questions about today's visit, request a non-urgent call back, or ask for a work or school excuse for 24 hours related to this e-Visit. If it has been greater than 24 hours you will need to follow up with your provider, or enter a new e-Visit to address those concerns. You will get an e-mail in the next two days asking about your experience.  I hope that your e-visit has been valuable and will speed your recovery. Thank you for using e-visits.

## 2021-03-18 ENCOUNTER — Telehealth: Payer: 59 | Admitting: Physician Assistant

## 2021-03-18 DIAGNOSIS — J019 Acute sinusitis, unspecified: Secondary | ICD-10-CM

## 2021-03-18 DIAGNOSIS — B9689 Other specified bacterial agents as the cause of diseases classified elsewhere: Secondary | ICD-10-CM

## 2021-03-18 MED ORDER — AMOXICILLIN-POT CLAVULANATE 875-125 MG PO TABS: 1.0000 | ORAL_TABLET | Freq: Two times a day (BID) | ORAL | 0 refills | Status: DC

## 2021-03-18 NOTE — Progress Notes (Signed)

## 2021-03-18 NOTE — Progress Notes (Signed)
I have spent 5 minutes in review of e-visit questionnaire, review and updating patient chart, medical decision making and response to patient.   Emme Rosenau Cody Aylin Rhoads, PA-C    

## 2021-03-23 ENCOUNTER — Telehealth: Payer: 59 | Admitting: Nurse Practitioner

## 2021-03-23 DIAGNOSIS — R059 Cough, unspecified: Secondary | ICD-10-CM

## 2021-03-23 MED ORDER — BENZONATATE 100 MG PO CAPS: 100.0000 mg | ORAL_CAPSULE | Freq: Three times a day (TID) | ORAL | 0 refills | Status: DC | PRN

## 2021-03-23 NOTE — Progress Notes (Signed)
We are sorry that you are not feeling well.  Here is how we plan to help!  Based on your presentation I believe you most likely have A cough due to a virus.  This is called viral bronchitis and is best treated by rest, plenty of fluids and control of the cough.  You may use Ibuprofen or Tylenol as directed to help your symptoms.     According to chart you were just treated with augmentin for sinus less then 5 days ago. You should still have 2 more days of meds left.  In addition you may use A prescription cough medication called Tessalon Perles 100mg . You may take 1-2 capsules every 8 hours as needed for your cough.   From your responses in the eVisit questionnaire you describe inflammation in the upper respiratory tract which is causing a significant cough.  This is commonly called Bronchitis and has four common causes:    Allergies  Viral Infections  Acid Reflux  Bacterial Infection Allergies, viruses and acid reflux are treated by controlling symptoms or eliminating the cause. An example might be a cough caused by taking certain blood pressure medications. You stop the cough by changing the medication. Another example might be a cough caused by acid reflux. Controlling the reflux helps control the cough.  USE OF BRONCHODILATOR ("RESCUE") INHALERS: There is a risk from using your bronchodilator too frequently.  The risk is that over-reliance on a medication which only relaxes the muscles surrounding the breathing tubes can reduce the effectiveness of medications prescribed to reduce swelling and congestion of the tubes themselves.  Although you feel brief relief from the bronchodilator inhaler, your asthma may actually be worsening with the tubes becoming more swollen and filled with mucus.  This can delay other crucial treatments, such as oral steroid medications. If you need to use a bronchodilator inhaler daily, several times per day, you should discuss this with your provider.  There are  probably better treatments that could be used to keep your asthma under control.     HOME CARE . Only take medications as instructed by your medical team. . Complete the entire course of an antibiotic. . Drink plenty of fluids and get plenty of rest. . Avoid close contacts especially the very young and the elderly . Cover your mouth if you cough or cough into your sleeve. . Always remember to wash your hands . A steam or ultrasonic humidifier can help congestion.   GET HELP RIGHT AWAY IF: . You develop worsening fever. . You become short of breath . You cough up blood. . Your symptoms persist after you have completed your treatment plan MAKE SURE YOU   Understand these instructions.  Will watch your condition.  Will get help right away if you are not doing well or get worse.  Your e-visit answers were reviewed by a board certified advanced clinical practitioner to complete your personal care plan.  Depending on the condition, your plan could have included both over the counter or prescription medications. If there is a problem please reply  once you have received a response from your provider. Your safety is important to .  If you have drug allergies check your prescription carefully.    You can use MyChart to ask questions about today's visit, request a non-urgent call back, or ask for a work or school excuse for 24 hours related to this e-Visit. If it has been greater than 24 hours you will need to follow up with  your provider, or enter a new e-Visit to address those concerns. You will get an e-mail in the next two days asking about your experience.  I hope that your e-visit has been valuable and will speed your recovery. Thank you for using e-visits.  5-10 minutes spent reviewing and documenting in chart.

## 2021-03-24 DIAGNOSIS — Z03818 Encounter for observation for suspected exposure to other biological agents ruled out: Secondary | ICD-10-CM | POA: Diagnosis not present

## 2021-03-24 DIAGNOSIS — J321 Chronic frontal sinusitis: Secondary | ICD-10-CM | POA: Diagnosis not present

## 2021-03-24 DIAGNOSIS — J40 Bronchitis, not specified as acute or chronic: Secondary | ICD-10-CM | POA: Diagnosis not present

## 2021-03-24 DIAGNOSIS — R059 Cough, unspecified: Secondary | ICD-10-CM | POA: Diagnosis not present

## 2021-04-24 ENCOUNTER — Other Ambulatory Visit: Payer: Self-pay | Admitting: Otolaryngology

## 2021-04-24 ENCOUNTER — Other Ambulatory Visit (HOSPITAL_COMMUNITY): Payer: Self-pay | Admitting: Otolaryngology

## 2021-04-24 DIAGNOSIS — J324 Chronic pansinusitis: Secondary | ICD-10-CM | POA: Diagnosis not present

## 2021-04-24 DIAGNOSIS — J320 Chronic maxillary sinusitis: Secondary | ICD-10-CM | POA: Diagnosis not present

## 2021-04-24 DIAGNOSIS — J301 Allergic rhinitis due to pollen: Secondary | ICD-10-CM | POA: Diagnosis not present

## 2021-04-24 DIAGNOSIS — R0683 Snoring: Secondary | ICD-10-CM | POA: Diagnosis not present

## 2021-05-11 ENCOUNTER — Other Ambulatory Visit: Payer: Self-pay

## 2021-05-11 ENCOUNTER — Ambulatory Visit
Admission: RE | Admit: 2021-05-11 | Discharge: 2021-05-11 | Disposition: A | Discharge status: Home / Self Care | Payer: 59 | Source: Ambulatory Visit | Attending: Otolaryngology | Admitting: Otolaryngology

## 2021-05-11 DIAGNOSIS — Z01818 Encounter for other preprocedural examination: Secondary | ICD-10-CM | POA: Diagnosis not present

## 2021-05-11 DIAGNOSIS — J324 Chronic pansinusitis: Secondary | ICD-10-CM | POA: Diagnosis not present

## 2021-05-11 DIAGNOSIS — J321 Chronic frontal sinusitis: Secondary | ICD-10-CM | POA: Diagnosis not present

## 2021-05-11 DIAGNOSIS — J3489 Other specified disorders of nose and nasal sinuses: Secondary | ICD-10-CM | POA: Diagnosis not present

## 2021-05-11 DIAGNOSIS — J342 Deviated nasal septum: Secondary | ICD-10-CM | POA: Diagnosis not present

## 2021-06-09 ENCOUNTER — Other Ambulatory Visit: Payer: Self-pay

## 2021-06-09 MED ORDER — NORGESTIM-ETH ESTRAD TRIPHASIC 0.18/0.215/0.25 MG-35 MCG PO TABS
ORAL_TABLET | ORAL | 0 refills | Status: DC
  Filled 2021-06-09 – 2021-06-23 (×2): qty 84, 84d supply, fill #0

## 2021-06-22 ENCOUNTER — Other Ambulatory Visit: Payer: Self-pay

## 2021-06-23 ENCOUNTER — Other Ambulatory Visit: Payer: Self-pay

## 2021-07-29 ENCOUNTER — Other Ambulatory Visit: Payer: Self-pay

## 2021-08-26 ENCOUNTER — Other Ambulatory Visit: Payer: Self-pay

## 2021-08-26 MED ORDER — CARESTART COVID-19 HOME TEST VI KIT
PACK | 0 refills | Status: DC
  Filled 2021-08-26: qty 2, 4d supply, fill #0

## 2021-08-29 IMAGING — CT CT MAXILLOFACIAL W/O CM
3 of 5 series · 16 of 47 positions shown, 19 images · non-contrast
Comparison: 01/27/2018

CLINICAL DATA: Preoperative planning.  Frontal headaches

EXAM:
CT MAXILLOFACIAL WITHOUT CONTRAST
TECHNIQUE: Multidetector CT images of the paranasal sinuses were obtained using
the standard protocol without intravenous contrast.

[Series 6: sinus 2.00 cor · coronal · 0.32mm/px · 3 of 78 slices shown]
[im 26/78  bone]
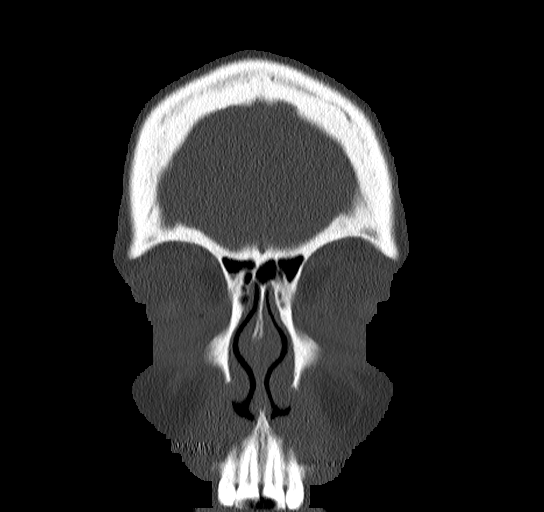
[im 35/78  bone]
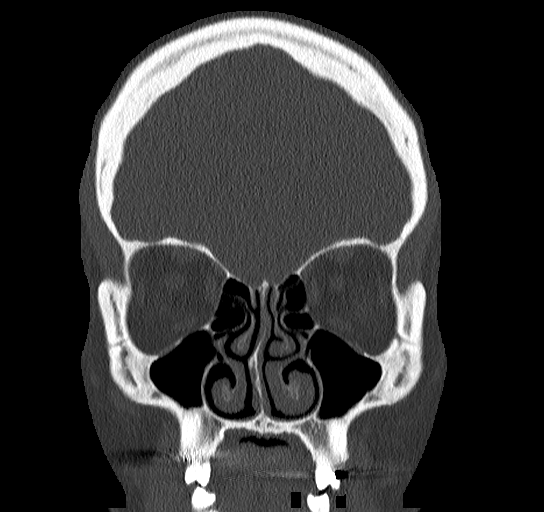
[im 43/78  bone]
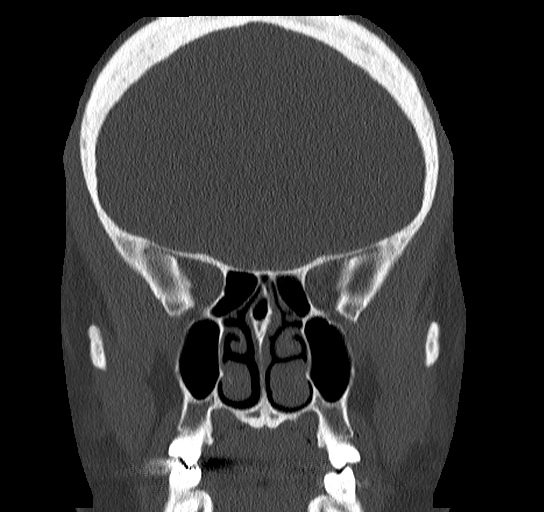

[Series 8: sinus 2.00 sag · sagittal · 0.31mm/px · 3 of 86 slices shown]
[im 29/86  bone]
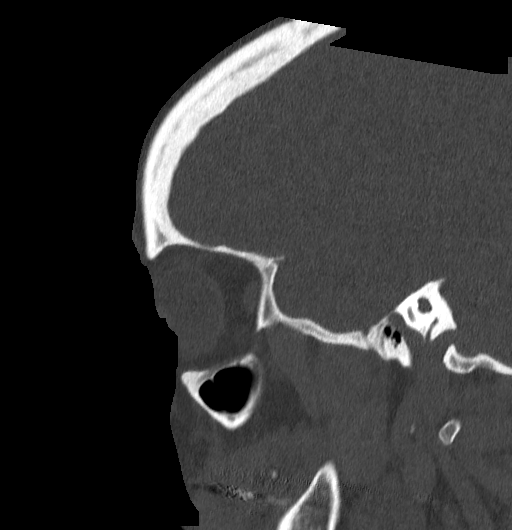
[im 43/86  bone]
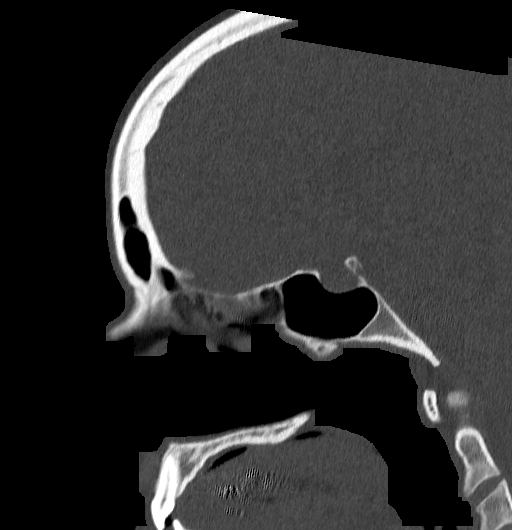
[im 57/86  bone]
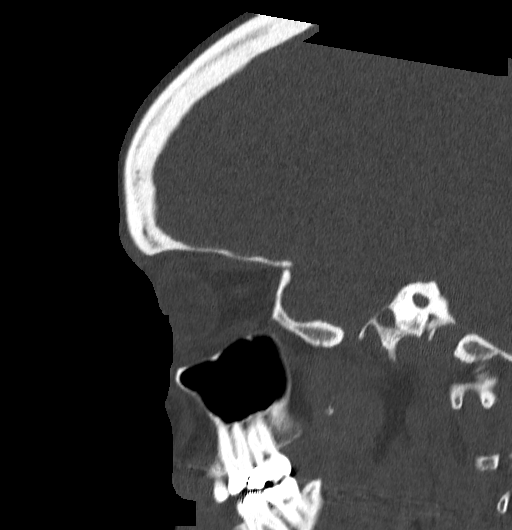

[Series 10: thins sinus 1.00 · axial · 0.36mm/px · z∈[-555,-417]mm · 10 of 162 slices shown, 13 images]
[im 12/162  brain]
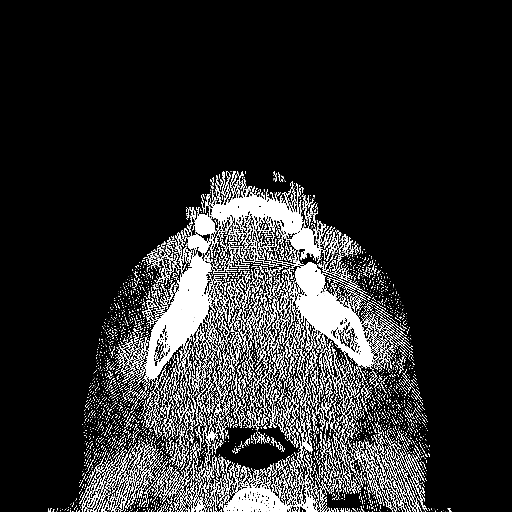
[im 12/162  bone]
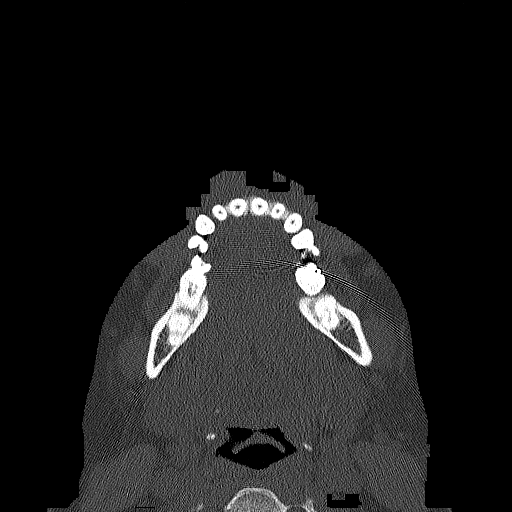
[im 24/162  bone]
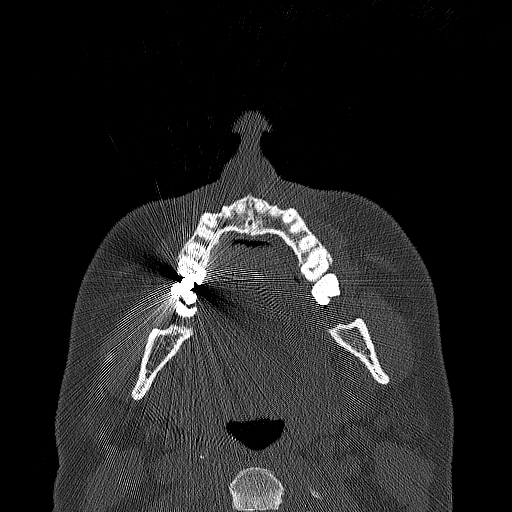
[im 47/162  bone]
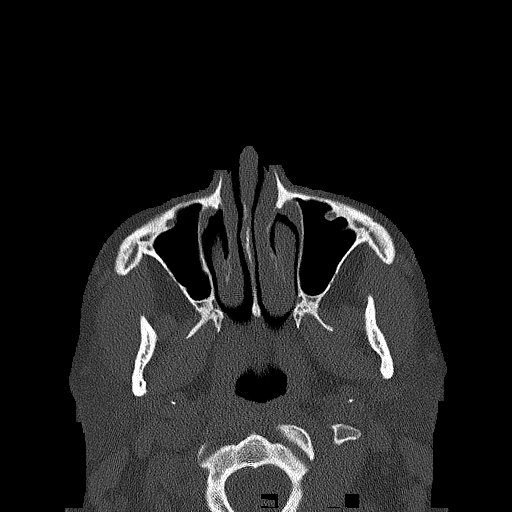
[im 58/162  bone]
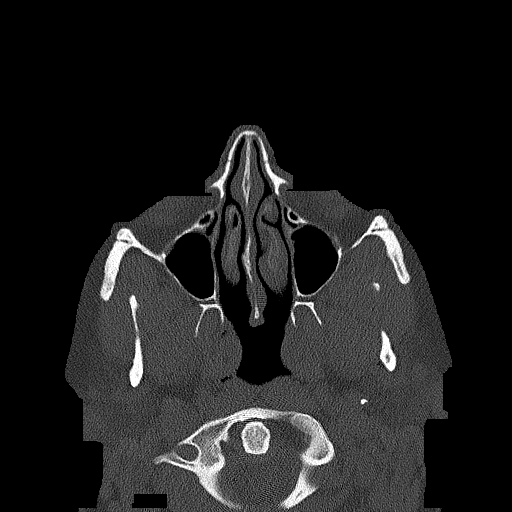
[im 70/162  brain]
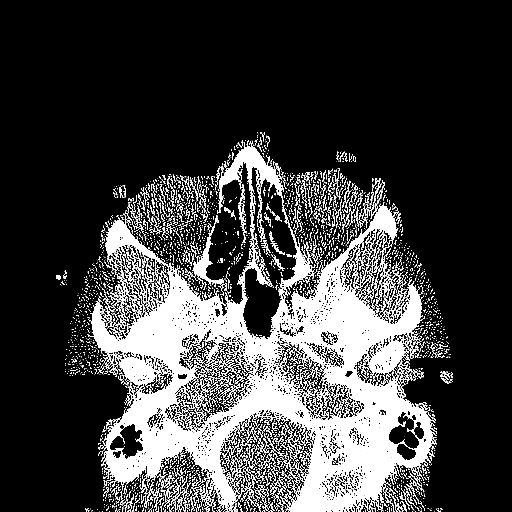
[im 70/162  bone]
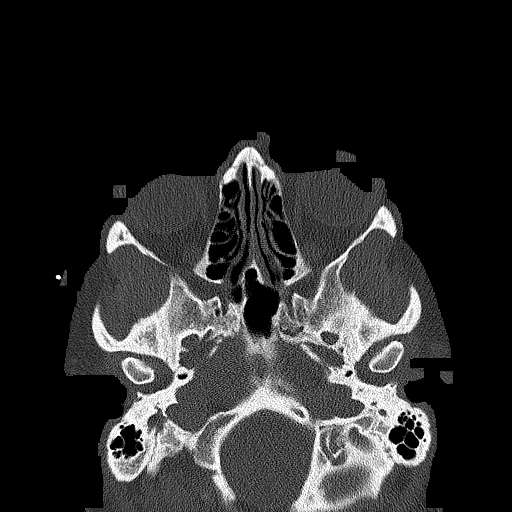
[im 93/162  bone]
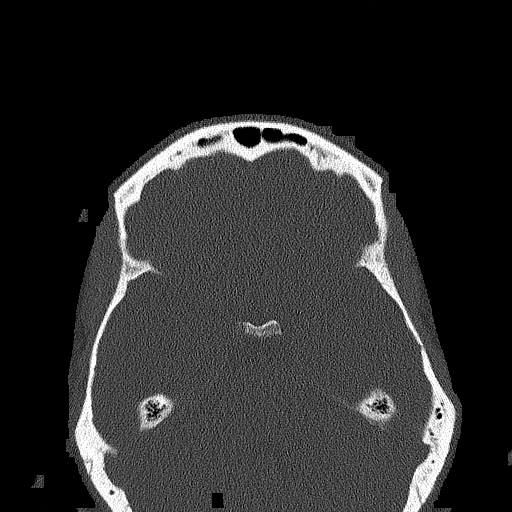
[im 104/162  bone]
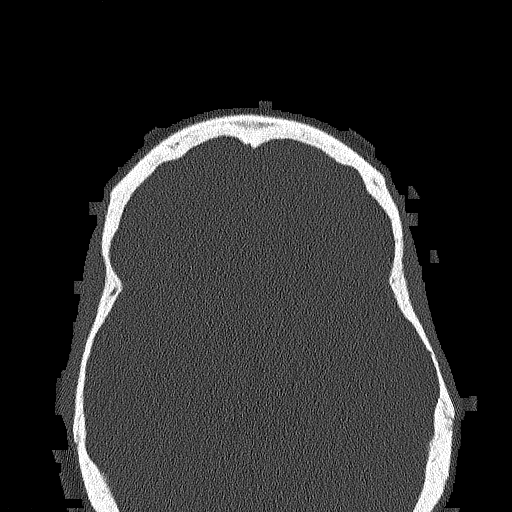
[im 116/162  bone]
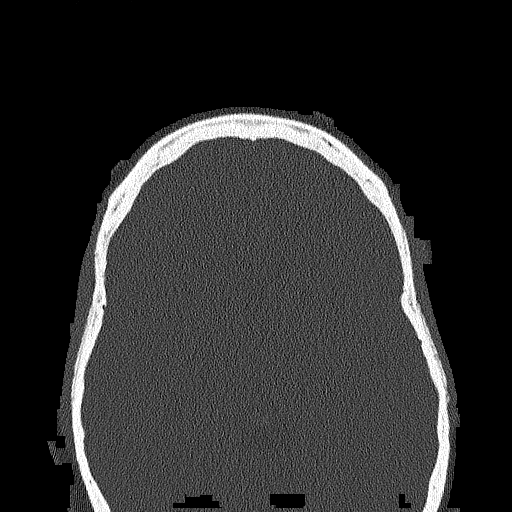
[im 139/162  brain]
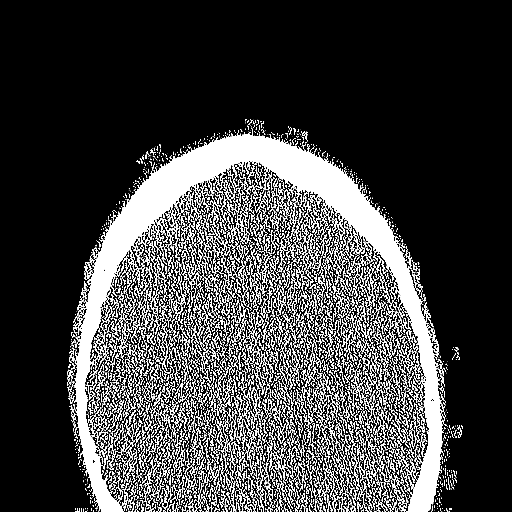
[im 139/162  bone]
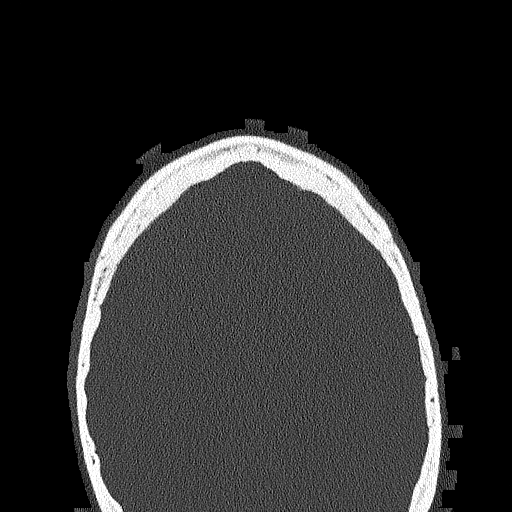
[im 150/162  bone]
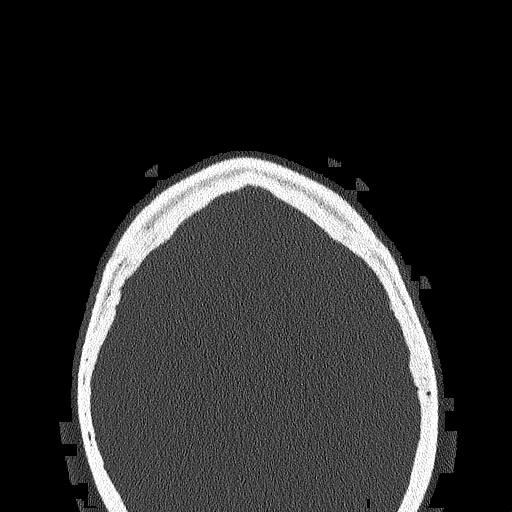

[16 of 47 positions shown; findings below may reference images not displayed]

FINDINGS: Paranasal sinuses:

Frontal: Normally aerated. Patent frontal sinus drainage pathways.

Ethmoid: Normally aerated.

Maxillary: Normally aerated.

Sphenoid: Left dominant air cell. Normally aerated. Patent
sphenoethmoidal recesses.

Right ostiomeatal unit: Patent.

Left ostiomeatal unit: Patent.

Nasal passages: Patent. Rightward nasal septal deviation. Small
rightward septal spur. Probable nasal sinus congestion.

Anatomy: No pneumatization superior to anterior ethmoid notches.
Sellar sphenoid pneumatization pattern. No dehiscence of carotid or
optic canals. No onodi cell.

Other: No incidental soft tissue finding
IMPRESSION: No active sinusitis or sinus outflow obstruction.

## 2021-10-08 ENCOUNTER — Other Ambulatory Visit (HOSPITAL_COMMUNITY)
Admission: RE | Admit: 2021-10-08 | Discharge: 2021-10-08 | Disposition: A | Discharge status: Home / Self Care | Payer: 59 | Source: Ambulatory Visit | Attending: Advanced Practice Midwife | Admitting: Advanced Practice Midwife

## 2021-10-08 ENCOUNTER — Other Ambulatory Visit: Payer: Self-pay

## 2021-10-08 ENCOUNTER — Encounter: Payer: Self-pay | Admitting: Advanced Practice Midwife

## 2021-10-08 ENCOUNTER — Ambulatory Visit (INDEPENDENT_AMBULATORY_CARE_PROVIDER_SITE_OTHER): Payer: 59 | Admitting: Advanced Practice Midwife

## 2021-10-08 VITALS — BP 100/60 | HR 74 | Ht 63.5 in | Wt 198.0 lb

## 2021-10-08 DIAGNOSIS — Z01419 Encounter for gynecological examination (general) (routine) without abnormal findings: Secondary | ICD-10-CM

## 2021-10-08 DIAGNOSIS — Z124 Encounter for screening for malignant neoplasm of cervix: Secondary | ICD-10-CM | POA: Insufficient documentation

## 2021-10-08 DIAGNOSIS — Z3041 Encounter for surveillance of contraceptive pills: Secondary | ICD-10-CM

## 2021-10-08 DIAGNOSIS — Z1239 Encounter for other screening for malignant neoplasm of breast: Secondary | ICD-10-CM | POA: Diagnosis not present

## 2021-10-08 MED ORDER — NORGESTIM-ETH ESTRAD TRIPHASIC 0.18/0.215/0.25 MG-35 MCG PO TABS: ORAL_TABLET | ORAL | 3 refills | Status: DC

## 2021-10-08 NOTE — Progress Notes (Signed)
Gynecology Annual Exam  PCP: [facility], [place]  Chief Complaint:  Chief Complaint  Patient presents with   Annual Exam    History of Present Illness: Patient is a 40 y.o. [id number] presents for annual exam. The patient has no gyn complaints today. She plans to find a PCP to discuss other health related concerns; cardiac, inflammatory changes. We discussed increasing healthy lifestyle and following an anti-inflammatory diet to help improve chronic conditions.   LMP: Patient's last menstrual period was 09/10/2021. Average Interval: regular, 28 days Duration of flow:  3-5  days Heavy Menses: no Clots: no Intermenstrual Bleeding: no Postcoital Bleeding: no Dysmenorrhea: no   The patient is sexually active. She currently uses OCP (estrogen/progesterone) for contraception. She denies dyspareunia.  The patient does perform self breast exams.  There is no notable family history of breast or ovarian cancer in her family. See note in last year's annual visit.  The patient wears seatbelts: yes.   The patient has regular exercise:  she has recently resumed exercising including walking, stretching. She admits a weakness for carbs/sweet tea. She usually has 6-7 hours of sleep .    The patient denies current symptoms of depression.    Review of Systems: Review of Systems  Constitutional:  Positive for malaise/fatigue. Negative for chills and fever.  HENT:  Positive for congestion and tinnitus. Negative for ear discharge, ear pain, hearing loss, sinus pain and sore throat.   Eyes:  Negative for blurred vision and double vision.  Respiratory:  Positive for cough and wheezing. Negative for shortness of breath.   Cardiovascular:  Negative for chest pain, palpitations and leg swelling.  Gastrointestinal:  Negative for abdominal pain, blood in stool, constipation, diarrhea, heartburn, melena, nausea and vomiting.  Genitourinary:  Negative for dysuria, flank pain, frequency, hematuria and  urgency.  Musculoskeletal:  Positive for joint pain. Negative for back pain and myalgias.  Skin:  Negative for itching and rash.  Neurological:  Positive for tingling. Negative for dizziness, tremors, sensory change, speech change, focal weakness, seizures, loss of consciousness, weakness and headaches.  Endo/Heme/Allergies:  Positive for environmental allergies. Does not bruise/bleed easily.  Psychiatric/Behavioral:  Negative for depression, hallucinations, memory loss, substance abuse and suicidal ideas. The patient is not nervous/anxious and does not have insomnia.    Past Medical History:  Patient Active Problem List   Diagnosis Date Noted   Family history of premature coronary artery disease 04/27/2018   Mixed hyperlipidemia 04/27/2018    Past Surgical History:  Past Surgical History:  Procedure Laterality Date   BREAST CYST ASPIRATION Right 2008   BREAST CYST ASPIRATION Right 2009   DILATION AND CURETTAGE OF UTERUS     EYE SURGERY     X 3   SHOULDER ARTHROSCOPY Right    TONSILLECTOMY N/A 07/13/2018   Procedure: TONSILLECTOMY;  Surgeon: Bud Face, MD;  Location: [facility] ORS;  Service: ENT;  Laterality: N/A;   TONSILLECTOMY     WISDOM TOOTH EXTRACTION      Gynecologic History:  Patient's last menstrual period was 09/10/2021. Contraception: OCP (estrogen/progesterone) Last Pap: 2019 Results were:  no abnormalities  Last mammogram: 2018 Results were: BI-RAD II benign  Obstetric History: [id number]  Family History:  Family History  Problem Relation Age of Onset   Hypertension Mother    Hyperlipidemia Mother    Diabetes Mother    Heart disease Father    CAD Father    Hyperlipidemia Father    Diabetes Maternal Grandmother  Hypertension Maternal Grandmother    Heart disease Paternal Grandmother    Heart disease Paternal Grandfather    Breast cancer Maternal Aunt 43   Heart disease Other    Breast cancer Cousin 35   Breast cancer Other    Breast cancer Other      Social History:  Social History   Socioeconomic History   Marital status: Married    Spouse name: Not on file   Number of children: Not on file   Years of education: Not on file   Highest education level: Not on file  Occupational History   Not on file  Tobacco Use   Smoking status: Never   Smokeless tobacco: Never  Vaping Use   Vaping Use: Never used  Substance and Sexual Activity   Alcohol use: Yes    Comment: OCC   Drug use: No   Sexual activity: Yes    Birth control/protection: Pill  Other Topics Concern   Not on file  Social History Narrative   Not on file   Social Determinants of Health   Financial Resource Strain: Not on file  Food Insecurity: Not on file  Transportation Needs: Not on file  Physical Activity: Not on file  Stress: Not on file  Social Connections: Not on file  Intimate Partner Violence: Not on file    Allergies:  Allergies  Allergen Reactions   Versed [Midazolam]     PT STATES SHE BECOMES VERY DISORIENTED AND LOOPY    Medications: Prior to Admission medications   Medication Sig Start Date End Date Taking? Authorizing Provider  acetaminophen (TYLENOL) 500 MG tablet Take 1,000 mg by mouth 2 (two) times daily as needed for moderate pain or headache.   Yes [provider]  albuterol (VENTOLIN HFA) 108 (90 Base) MCG/ACT inhaler Inhale 2 puffs into the lungs every 6 (six) hours as needed for wheezing or shortness of breath.  10/18/17  Yes [provider]  albuterol (VENTOLIN HFA) 108 (90 Base) MCG/ACT inhaler INHALE 2 PUFFS BY MOUTH 4 TIMES DAILY AS NEEDED FOR SHORTNESS OF BREATH AND WHEEZING AND COUGH 12/16/20 12/16/21 Yes Cyndie Chime, MD  fluticasone [facility]) 50 MCG/ACT nasal spray Place 2 sprays into both nostrils daily. 10/30/19  Yes Hedges, Dellis Filbert, PA-C  Hypromellose (ARTIFICIAL TEARS OP) Place 1 drop into both eyes daily as needed (dry eyes).   Yes [provider]  Menthol, Topical Analgesic, (ICY HOT  EX) Apply 1 application topically daily as needed (pain).   Yes [provider]  Multiple Vitamin (MULTI-VITAMINS) TABS Take 1 tablet by mouth every evening.    Yes [provider]  Omega-3 Fatty Acids (FISH OIL ULTRA) 1400 MG CAPS Take 1,400 mg by mouth every evening.   Yes [provider]  ondansetron (ZOFRAN-ODT) 4 MG disintegrating tablet DISSOLVE 1 TABLET ON THE TONGUE EVERY 8 HOURS AS NEEDED FOR NAUSEA FOR UP TO 3 DAYS 12/16/20 12/16/21 Yes Cyndie Chime, MD  sodium chloride (OCEAN) 0.65 % SOLN nasal spray Place 1 spray into both nostrils as needed for congestion.   Yes [provider]  vitamin C (ASCORBIC ACID) 500 MG tablet Take 500 mg by mouth every evening.   Yes [provider]  Norgestimate-Ethinyl Estradiol Triphasic (TRI-SPRINTEC) 0.18/0.215/0.25 MG-35 MCG tablet Take 1 tablet by mouth once daily 10/08/21   Rod Can, CNM  loratadine (CLARITIN) 10 MG tablet Take 10 mg by mouth daily as needed for allergies.  12/09/20  [provider]    Physical  Exam Vitals: Blood pressure 100/60, pulse 74, height 5' 3.5" (1.613 m), weight 198 lb (89.8 kg), last menstrual period 09/10/2021.  General: NAD HEENT: normocephalic, anicteric Thyroid: no enlargement, no palpable nodules Pulmonary: No increased work of breathing, CTAB Cardiovascular: RRR, distal pulses 2+ Breast: Breast symmetrical, no tenderness, no palpable nodules or masses, no skin or nipple retraction present, no nipple discharge.  No axillary or supraclavicular lymphadenopathy. Abdomen: NABS, soft, non-tender, non-distended.  Umbilicus without lesions.  No hepatomegaly, splenomegaly or masses palpable. No evidence of hernia  Genitourinary:  External: Normal external female genitalia.  Normal urethral meatus, normal Bartholin's and Skene's glands.    Vagina: Normal vaginal mucosa, no evidence of prolapse.    Cervix: Grossly normal in appearance, no bleeding, no  CMT Extremities: no edema, erythema, or tenderness Neurologic: Grossly intact Psychiatric: mood appropriate, affect full   Assessment: 40 y.o. [id number] routine annual exam  Plan: Problem List Items Addressed This Visit   None Visit Diagnoses     Well woman exam with routine gynecological exam    -  Primary   Relevant Medications   Norgestimate-Ethinyl Estradiol Triphasic (TRI-SPRINTEC) 0.18/0.215/0.25 MG-35 MCG tablet   Other Relevant Orders   MM 3D SCREEN BREAST BILATERAL   Cytology - PAP   Screening for cervical cancer       Relevant Orders   Cytology - PAP   Encounter for surveillance of contraceptive pills       Relevant Medications   Norgestimate-Ethinyl Estradiol Triphasic (TRI-SPRINTEC) 0.18/0.215/0.25 MG-35 MCG tablet   Breast screening       Relevant Orders   MM 3D SCREEN BREAST BILATERAL       1) Mammogram - recommend yearly screening mammogram.  Mammogram Was ordered today   2) STI screening  was offered and declined  3) ASCCP guidelines and rationale discussed.  Patient opts for every 5 years screening interval if normal screen today  4) Contraception - the patient is currently using  OCP (estrogen/progesterone).  She is happy with her current form of contraception and plans to continue  5) Colonoscopy -- Screening recommended starting at age 74 for average risk individuals, age 63 for individuals deemed at increased risk (including African Americans) and recommended to continue until age 48.  For patient age 60-85 individualized approach is recommended.  Gold standard screening is via colonoscopy, Cologuard screening is an acceptable alternative for patient unwilling or unable to undergo colonoscopy.  "Colorectal cancer screening for average?risk adults: 2018 guideline update from the [employer]: A Cancer Journal for Clinicians: May 04, 2017   6) Routine healthcare maintenance including cholesterol, diabetes screening discussed Declines  7)  Increase healthy lifestyle; diet, hydration, exercise  8) Return in about 1 year (around 10/08/2022) for annual established gyn.   Rod Can, [facility] 10/08/2021, 2:36 PM

## 2021-10-12 LAB — CYTOLOGY - PAP
Comment: NEGATIVE
Diagnosis: NEGATIVE
High risk HPV: NEGATIVE

## 2021-11-05 ENCOUNTER — Telehealth: Payer: Self-pay

## 2021-11-05 NOTE — Telephone Encounter (Signed)
Please schedule pt for 12/6 at 9:50 per our conversation

## 2021-11-05 NOTE — Telephone Encounter (Signed)
Pt husband calling stating that she is having some pain while working. Pt having a lot of pressure and pain in pelvic/bladder pain. Offered appt 12/6 at 9:50. Pt will call back soon.

## 2021-11-10 ENCOUNTER — Other Ambulatory Visit (INDEPENDENT_AMBULATORY_CARE_PROVIDER_SITE_OTHER): Payer: 59

## 2021-11-10 ENCOUNTER — Ambulatory Visit (INDEPENDENT_AMBULATORY_CARE_PROVIDER_SITE_OTHER): Payer: 59 | Admitting: Obstetrics and Gynecology

## 2021-11-10 ENCOUNTER — Other Ambulatory Visit: Payer: Self-pay

## 2021-11-10 ENCOUNTER — Encounter: Payer: Self-pay | Admitting: Obstetrics and Gynecology

## 2021-11-10 VITALS — BP 110/80 | Ht 63.5 in | Wt 198.0 lb

## 2021-11-10 DIAGNOSIS — R102 Pelvic and perineal pain: Secondary | ICD-10-CM

## 2021-11-10 DIAGNOSIS — Z3009 Encounter for other general counseling and advice on contraception: Secondary | ICD-10-CM

## 2021-11-10 LAB — POCT URINALYSIS DIPSTICK
Bilirubin, UA: NEGATIVE
Blood, UA: NEGATIVE
Glucose, UA: NEGATIVE
Ketones, UA: NEGATIVE
Leukocytes, UA: NEGATIVE
Nitrite, UA: NEGATIVE
Protein, UA: NEGATIVE
Spec Grav, UA: 1.02 (ref 1.010–1.025)
pH, UA: 6 (ref 5.0–8.0)

## 2021-11-10 LAB — POCT URINE PREGNANCY: Preg Test, Ur: NEGATIVE

## 2021-11-10 NOTE — Progress Notes (Signed)
[facility], [place]   Chief Complaint  Patient presents with   Pelvic Floor Pain    Since last Thursday, severe pain and a lot of pressure    HPI:      [name] is a 40 y.o. [id number] whose LMP was Patient's last menstrual period was 10/20/2021 (approximate)., presents today for severe suprapubic pain that occurred about 6 days ago while sitting at work. Pt then had significant pressure with standing. Sx lasted about 15 minutes then eased up, then resolved. No vag, urin, GI sx with sx. Pt did self exam and thought she felt bulge vaginally.  Pelvic pressure sx then recurred yesterday but wasn't as bad as initially, and no pain. Again, no vag, urin, GI sx. Pt does lifting of pts at work but wasn't lifting prior to sx start. Feels like she has issues with pelvic relaxation and has done kegels in past, but doing more frequently now. Not sure if muscles are strong enough.  Pt has issues with straining for BMs but doesn't feel like stools are hard. Hasn't tried fiber.  She is sex active, no pain/dyspareunia.  Menses are montly, lasting 4-5 days, no BTB, mild dysmen. On OCPs. Has missed some pills this month. May want other form of BC.   Patient Active Problem List   Diagnosis Date Noted   Family history of premature coronary artery disease 04/27/2018   Mixed hyperlipidemia 04/27/2018    Past Surgical History:  Procedure Laterality Date   BREAST CYST ASPIRATION Right 2008   BREAST CYST ASPIRATION Right 2009   DILATION AND CURETTAGE OF UTERUS     EYE SURGERY     X 3   SHOULDER ARTHROSCOPY Right    TONSILLECTOMY N/A 07/13/2018   Procedure: TONSILLECTOMY;  Surgeon: Carloyn Manner, MD;  Location: [facility] ORS;  Service: ENT;  Laterality: N/A;   TONSILLECTOMY     WISDOM TOOTH EXTRACTION      Family History  Problem Relation Age of Onset   Hypertension Mother    Hyperlipidemia Mother    Diabetes Mother    Heart disease Father    CAD Father    Hyperlipidemia  Father    Breast cancer Maternal Aunt 86   Diabetes Maternal Grandmother    Hypertension Maternal Grandmother    Heart disease Paternal Grandmother    Heart disease Paternal Grandfather    Breast cancer Cousin 90   Heart disease Other    Breast cancer Other    Breast cancer Other     Social History   Socioeconomic History   Marital status: Married    Spouse name: Not on file   Number of children: Not on file   Years of education: Not on file   Highest education level: Not on file  Occupational History   Not on file  Tobacco Use   Smoking status: Never   Smokeless tobacco: Never  Vaping Use   Vaping Use: Never used  Substance and Sexual Activity   Alcohol use: Yes    Comment: OCC   Drug use: No   Sexual activity: Yes    Birth control/protection: Pill  Other Topics Concern   Not on file  Social History Narrative   Not on file   Social Determinants of Health   Financial Resource Strain: Not on file  Food Insecurity: Not on file  Transportation Needs: Not on file  Physical Activity: Not on file  Stress: Not on file  Social Connections: Not  on file  Intimate Partner Violence: Not on file    Outpatient Medications Prior to Visit  Medication Sig Dispense Refill   acetaminophen (TYLENOL) 500 MG tablet Take 1,000 mg by mouth 2 (two) times daily as needed for moderate pain or headache.     albuterol (VENTOLIN HFA) 108 (90 Base) MCG/ACT inhaler Inhale 2 puffs into the lungs every 6 (six) hours as needed for wheezing or shortness of breath.      fluticasone (FLONASE) 50 MCG/ACT nasal spray Place 2 sprays into both nostrils daily. 16 g 6   Hypromellose (ARTIFICIAL TEARS OP) Place 1 drop into both eyes daily as needed (dry eyes).     Menthol, Topical Analgesic, (ICY HOT EX) Apply 1 application topically daily as needed (pain).     Multiple Vitamin (MULTI-VITAMINS) TABS Take 1 tablet by mouth every evening.      Norgestimate-Ethinyl Estradiol Triphasic (TRI-SPRINTEC)  0.18/0.215/0.25 MG-35 MCG tablet Take 1 tablet by mouth once daily 90 tablet 3   Omega-3 Fatty Acids (FISH OIL ULTRA) 1400 MG CAPS Take 1,400 mg by mouth every evening.     sodium chloride (OCEAN) 0.65 % SOLN nasal spray Place 1 spray into both nostrils as needed for congestion.     vitamin C (ASCORBIC ACID) 500 MG tablet Take 500 mg by mouth every evening.     albuterol (VENTOLIN HFA) 108 (90 Base) MCG/ACT inhaler INHALE 2 PUFFS BY MOUTH 4 TIMES DAILY AS NEEDED FOR SHORTNESS OF BREATH AND WHEEZING AND COUGH 18 g 0   ondansetron (ZOFRAN-ODT) 4 MG disintegrating tablet DISSOLVE 1 TABLET ON THE TONGUE EVERY 8 HOURS AS NEEDED FOR NAUSEA FOR UP TO 3 DAYS 6 tablet 0   No facility-administered medications prior to visit.      ROS:  Review of Systems  Constitutional:  Negative for fever.  Gastrointestinal:  Negative for blood in stool, constipation, diarrhea, nausea and vomiting.  Genitourinary:  Positive for pelvic pain. Negative for dyspareunia, dysuria, flank pain, frequency, hematuria, urgency, vaginal bleeding, vaginal discharge and vaginal pain.  Musculoskeletal:  Negative for back pain.  Skin:  Negative for rash.  BREAST: No symptoms   OBJECTIVE:   Vitals:  BP 110/80   Ht 5' 3.5" (1.613 m)   Wt 198 lb (89.8 kg)   LMP 10/20/2021 (Approximate)   BMI 34.52 kg/m   Physical Exam Vitals reviewed.  Constitutional:      Appearance: She is well-developed.  Pulmonary:     Effort: Pulmonary effort is normal.  Abdominal:     Palpations: Abdomen is soft.     Tenderness: There is no abdominal tenderness. There is no guarding or rebound.  Genitourinary:    General: Normal vulva.     Pubic Area: No rash.      Labia:        Right: No rash, tenderness or lesion.        Left: No rash, tenderness or lesion.      Vagina: Normal. No vaginal discharge, erythema, tenderness, bleeding or prolapsed vaginal walls.     Cervix: Normal.     Uterus: Normal. Not enlarged and not tender.       Adnexa: Right adnexa normal and left adnexa normal.       Right: No mass or tenderness.         Left: No mass or tenderness.       Comments: MILD RECTOCELE WITH VALSALVA Musculoskeletal:        General: Normal range of motion.  Cervical back: Normal range of motion.  Skin:    General: Skin is warm and dry.  Neurological:     General: No focal deficit present.     Mental Status: She is alert and oriented to person, place, and time.  Psychiatric:        Mood and Affect: Mood normal.        Behavior: Behavior normal.        Thought Content: Thought content normal.        Judgment: Judgment normal.    Results: Results for orders placed or performed in visit on 11/10/21 (from the past 24 hour(s))  POCT Urinalysis Dipstick     Status: Normal   Collection Time: 11/10/21  2:13 PM  Result Value Ref Range   Color, UA yellow    Clarity, UA clear    Glucose, UA Negative Negative   Bilirubin, UA neg    Ketones, UA neg    Spec Grav, UA 1.020 1.010 - 1.025   Blood, UA neg    pH, UA 6.0 5.0 - 8.0   Protein, UA Negative Negative   Urobilinogen, UA     Nitrite, UA neg    Leukocytes, UA Negative Negative   Appearance     Odor    POCT urine pregnancy     Status: Normal   Collection Time: 11/10/21  2:13 PM  Result Value Ref Range   Preg Test, Ur Negative Negative   ULTRASOUND REPORT   Location: [place]  Date of Service: 11/10/2021      Indications:Pelvic Pain & pressure Findings:  The uterus is anteverted and measures 7.7 x 4.0 x 5.0 cm. Echo texture is heterogenous without evidence of focal masses.   The Endometrium measures 3.7 mm.   Right Ovary measures 1.9 x 2.1 x .8 cm. It is normal in appearance. Left Ovary measures 3.1 x 2.3 x 2.8 cm and contains a [id number] dominant follicle/cyst. Bilateral Ovaries demonstrate normal blood flow on color doppler.   Survey of the adnexa demonstrates no adnexal masses. There is no free fluid in the cul de sac; (images labeled  "culdesac" were initially mistaken for free fluid, but later proved to be Left Ovary w/ follicle).   Impression: 1. Normal Pelvic Ultrasound.   Recommendations: 1.Clinical correlation with the patient's History and Physical Exam.   Vivien Rota  Henderson-Gainey  Assessment/Plan: Pelvic pain - Plan: POCT Urinalysis Dipstick, POCT urine pregnancy, US PELVIS TRANSVAGINAL NON-OB (TV ONLY); neg UPT, neg UA, neg GYN u/s. Question MSK. Discussed kegel exercises in general for pelvic support, especially with lifting. Can do pelvic PT ref prn. Also minimize straining with increased fiber BM issues. F/u prn.   Pelvic pressure in female - Plan: US PELVIS TRANSVAGINAL NON-OB (TV ONLY), sx resolved currently  Encounter for other general counseling or advice on contraception--discussed non-daily nuvaring vs IUD. Pt to consider and f/u with menses for Mirena insertion if decides on IUD. Can also try nuvaring. Pt to consider and f/u prn.    Return if symptoms worsen or fail to improve.  Lavern Crimi B. Renita Brocks, PA-C 11/10/2021 2:16 PM

## 2021-12-08 ENCOUNTER — Telehealth: Payer: 59 | Admitting: Family Medicine

## 2021-12-08 DIAGNOSIS — R051 Acute cough: Secondary | ICD-10-CM | POA: Diagnosis not present

## 2021-12-08 MED ORDER — PROMETHAZINE-DM 6.25-15 MG/5ML PO SYRP: 2.5000 mL | ORAL_SOLUTION | Freq: Four times a day (QID) | ORAL | 0 refills | Status: DC | PRN

## 2021-12-08 MED ORDER — PREDNISONE 10 MG (21) PO TBPK: ORAL_TABLET | ORAL | 0 refills | Status: DC

## 2021-12-08 NOTE — Patient Instructions (Signed)
Please keep well-hydrated and get plenty of rest. Start a saline nasal rinse to flush out your nasal passages. You can use plain Mucinex to help thin congestion. If you have a humidifier, running in the bedroom at night. I want you to start OTC vitamin D3 1000 units daily, vitamin C 1000 mg daily, and a zinc supplement. Please take prescribed medications as directed.  You have been enrolled in a MyChart symptom monitoring program. Please answer these questions daily so we can keep track of how you are doing.  You were to quarantine for 5 days from onset of your symptoms.  After day 5, if you have had no fever and you are feeling better, you can end quarantine but need to mask for an additional 5 days. After day 5 if you have a fever or are having significant symptoms, please quarantine for full 10 days.  If you note any worsening of symptoms, any significant shortness of breath or any chest pain, please seek ER evaluation ASAP.  Please do not delay care!  COVID-19: What to Do if You Are Sick If you test positive and are an older adult or someone who is at high risk of getting very sick from COVID-19, treatment may be available. Contact a healthcare provider right away after a positive test to determine if you are eligible, even if your symptoms are mild right now. You can also visit a Test to Treat location and, if eligible, receive a prescription from a provider. Don't delay: Treatment must be started within the first few days to be effective. If you have a fever, cough, or other symptoms, you might have COVID-19. Most people have mild illness and are able to recover at home. If you are sick: Keep track of your symptoms. If you have an emergency warning sign (including trouble breathing), call 911. Steps to help prevent the spread of COVID-19 if you are sick If you are sick with COVID-19 or think you might have COVID-19, follow the steps below to care for yourself and to help protect other  people in your home and community. Stay home except to get medical care Stay home. Most people with COVID-19 have mild illness and can recover at home without medical care. Do not leave your home, except to get medical care. Do not visit public areas and do not go to places where you are unable to wear a mask. Take care of yourself. Get rest and stay hydrated. Take over-the-counter medicines, such as acetaminophen, to help you feel better. Stay in touch with your doctor. Call before you get medical care. Be sure to get care if you have trouble breathing, or have any other emergency warning signs, or if you think it is an emergency. Avoid public transportation, ride-sharing, or taxis if possible. Get tested If you have symptoms of COVID-19, get tested. While waiting for test results, stay away from others, including staying apart from those living in your household. Get tested as soon as possible after your symptoms start. Treatments may be available for people with COVID-19 who are at risk for becoming very sick. Don't delay: Treatment must be started early to be effective--some treatments must begin within 5 days of your first symptoms. Contact your healthcare provider right away if your test result is positive to determine if you are eligible. Self-tests are one of several options for testing for the virus that causes COVID-19 and may be more convenient than laboratory-based tests and point-of-care tests. Ask your healthcare provider or   your local health department if you need help interpreting your test results. You can visit your state, tribal, local, and territorial health department's website to look for the latest local information on testing sites. Separate yourself from other people As much as possible, stay in a specific room and away from other people and pets in your home. If possible, you should use a separate bathroom. If you need to be around other people or animals in or outside of the  home, wear a well-fitting mask. Tell your close contacts that they may have been exposed to COVID-19. An infected person can spread COVID-19 starting 48 hours (or 2 days) before the person has any symptoms or tests positive. By letting your close contacts know they may have been exposed to COVID-19, you are helping to protect everyone. See COVID-19 and Animals if you have questions about pets. If you are diagnosed with COVID-19, someone from the health department may call you. Answer the call to slow the spread. Monitor your symptoms Symptoms of COVID-19 include fever, cough, or other symptoms. Follow care instructions from your healthcare provider and local health department. Your local health authorities may give instructions on checking your symptoms and reporting information. When to seek emergency medical attention Look for emergency warning signs* for COVID-19. If someone is showing any of these signs, seek emergency medical care immediately: Trouble breathing Persistent pain or pressure in the chest New confusion Inability to wake or stay awake Pale, gray, or blue-colored skin, lips, or nail beds, depending on skin tone *This list is not all possible symptoms. Please call your medical provider for any other symptoms that are severe or concerning to you. Call 911 or call ahead to your local emergency facility: Notify the operator that you are seeking care for someone who has or may have COVID-19. Call ahead before visiting your doctor Call ahead. Many medical visits for routine care are being postponed or done by phone or telemedicine. If you have a medical appointment that cannot be postponed, call your doctor's office, and tell them you have or may have COVID-19. This will help the office protect themselves and other patients. If you are sick, wear a well-fitting mask You should wear a mask if you must be around other people or animals, including pets (even at home). Wear a mask with the  best fit, protection, and comfort for you. You don't need to wear the mask if you are alone. If you can't put on a mask (because of trouble breathing, for example), cover your coughs and sneezes in some other way. Try to stay at least 6 feet away from other people. This will help protect the people around you. Masks should not be placed on young children under age 2 years, anyone who has trouble breathing, or anyone who is not able to remove the mask without help. Cover your coughs and sneezes Cover your mouth and nose with a tissue when you cough or sneeze. Throw away used tissues in a lined trash can. Immediately wash your hands with soap and water for at least 20 seconds. If soap and water are not available, clean your hands with an alcohol-based hand sanitizer that contains at least 60% alcohol. Clean your hands often Wash your hands often with soap and water for at least 20 seconds. This is especially important after blowing your nose, coughing, or sneezing; going to the bathroom; and before eating or preparing food. Use hand sanitizer if soap and water are not available. Use an   alcohol-based hand sanitizer with at least 60% alcohol, covering all surfaces of your hands and rubbing them together until they feel dry. Soap and water are the best option, especially if hands are visibly dirty. Avoid touching your eyes, nose, and mouth with unwashed hands. Handwashing Tips Avoid sharing personal household items Do not share dishes, drinking glasses, cups, eating utensils, towels, or bedding with other people in your home. Wash these items thoroughly after using them with soap and water or put in the dishwasher. Clean surfaces in your home regularly Clean and disinfect high-touch surfaces (for example, doorknobs, tables, handles, light switches, and countertops) in your "sick room" and bathroom. In shared spaces, you should clean and disinfect surfaces and items after each use by the person who is  ill. If you are sick and cannot clean, a caregiver or other person should only clean and disinfect the area around you (such as your bedroom and bathroom) on an as needed basis. Your caregiver/other person should wait as long as possible (at least several hours) and wear a mask before entering, cleaning, and disinfecting shared spaces that you use. Clean and disinfect areas that may have blood, stool, or body fluids on them. Use household cleaners and disinfectants. Clean visible dirty surfaces with household cleaners containing soap or detergent. Then, use a household disinfectant. Use a product from EPA's List N: Disinfectants for Coronavirus (COVID-19). Be sure to follow the instructions on the label to ensure safe and effective use of the product. Many products recommend keeping the surface wet with a disinfectant for a certain period of time (look at "contact time" on the product label). You may also need to wear personal protective equipment, such as gloves, depending on the directions on the product label. Immediately after disinfecting, wash your hands with soap and water for 20 seconds. For completed guidance on cleaning and disinfecting your home, visit Complete Disinfection Guidance. Take steps to improve ventilation at home Improve ventilation (air flow) at home to help prevent from spreading COVID-19 to other people in your household. Clear out COVID-19 virus particles in the air by opening windows, using air filters, and turning on fans in your home. Use this interactive tool to learn how to improve air flow in your home. When you can be around others after being sick with COVID-19 Deciding when you can be around others is different for different situations. Find out when you can safely end home isolation. For any additional questions about your care, contact your healthcare provider or state or local health department. 02/24/2021 Content source: National Center for Immunization and  Respiratory Diseases (NCIRD), Division of Viral Diseases This information is not intended to replace advice given to you by your health care provider. Make sure you discuss any questions you have with your health care provider. Document Revised: 04/09/2021 Document Reviewed: 04/09/2021 Elsevier Patient Education  2022 [employer]    

## 2021-12-08 NOTE — Progress Notes (Signed)
Virtual Visit Consent   Pamela Hamilton, you are scheduled for a virtual visit with a [facility] provider today.     Just as with appointments in the office, your consent must be obtained to participate.  Your consent will be active for this visit and any virtual visit you may have with one of our providers in the next 365 days.     If you have a MyChart account, a copy of this consent can be sent to you electronically.  All virtual visits are billed to your insurance company just like a traditional visit in the office.    As this is a virtual visit, video technology does not allow for your provider to perform a traditional examination.  This may limit your provider's ability to fully assess your condition.  If your provider identifies any concerns that need to be evaluated in person or the need to arrange testing (such as labs, EKG, etc.), we will make arrangements to do so.     Although advances in technology are sophisticated, we cannot ensure that it will always work on either your end or our end.  If the connection with a video visit is poor, the visit may have to be switched to a telephone visit.  With either a video or telephone visit, we are not always able to ensure that we have a secure connection.     I need to obtain your verbal consent now.   Are you willing to proceed with your visit today?    Pamela Hamilton has provided verbal consent on 12/08/2021 for a virtual visit (video or telephone).   Freddy Finner, NP   Date: 12/08/2021 11:25 AM   Virtual Visit via Video Note   I, Freddy Finner, connected with  Pamela Hamilton  ([id number], Sep 06, 1981) on 12/08/21 at 11:30 AM EST by a video-enabled telemedicine application and verified that I am speaking with the correct person using two identifiers.  Location: Patient: Virtual Visit Location Patient: Home Provider: Virtual Visit Location Provider: Home Office   I discussed the limitations of evaluation and management by  telemedicine and the availability of in person appointments. The patient expressed understanding and agreed to proceed.    History of Present Illness: Pamela Hamilton is a 41 y.o. who identifies as a female who was assigned female at birth, and is being seen today for sinus symptoms and known exposure to a COVID + family member. Health at work PCR test. Symptoms started was Sunday the 12/06/21, has had some sinus issues off and on over the month of Dec.   Traveled to [place] last week of Dec, did wear mask, but Husband tested positive on Sunday. She started with cough, sinus congestion, sore throat, chest tightness, and pain and can reproduce the pain when she presses it. Is using mucinex and flonase to help some. Chest congestion just started today- when she woke up. Ha a history of bronchitis, also has asthma exercised induced.   Problems:  Patient Active Problem List   Diagnosis Date Noted   Family history of premature coronary artery disease 04/27/2018   Mixed hyperlipidemia 04/27/2018    Allergies:  Allergies  Allergen Reactions   Versed [Midazolam]     PT STATES SHE BECOMES VERY DISORIENTED AND LOOPY   Medications:  Current Outpatient Medications:    acetaminophen (TYLENOL) 500 MG tablet, Take 1,000 mg by mouth 2 (two) times daily as needed for moderate pain or headache., Disp: , Rfl:  albuterol (VENTOLIN HFA) 108 (90 Base) MCG/ACT inhaler, Inhale 2 puffs into the lungs every 6 (six) hours as needed for wheezing or shortness of breath. , Disp: , Rfl:    fluticasone (FLONASE) 50 MCG/ACT nasal spray, Place 2 sprays into both nostrils daily., Disp: 16 g, Rfl: 6   Hypromellose (ARTIFICIAL TEARS OP), Place 1 drop into both eyes daily as needed (dry eyes)., Disp: , Rfl:    Menthol, Topical Analgesic, (ICY HOT EX), Apply 1 application topically daily as needed (pain)., Disp: , Rfl:    Multiple Vitamin (MULTI-VITAMINS) TABS, Take 1 tablet by mouth every evening. , Disp: , Rfl:     Norgestimate-Ethinyl Estradiol Triphasic (TRI-SPRINTEC) 0.18/0.215/0.25 MG-35 MCG tablet, Take 1 tablet by mouth once daily, Disp: 90 tablet, Rfl: 3   Omega-3 Fatty Acids (FISH OIL ULTRA) 1400 MG CAPS, Take 1,400 mg by mouth every evening., Disp: , Rfl:    sodium chloride (OCEAN) 0.65 % SOLN nasal spray, Place 1 spray into both nostrils as needed for congestion., Disp: , Rfl:    vitamin C (ASCORBIC ACID) 500 MG tablet, Take 500 mg by mouth every evening., Disp: , Rfl:   Observations/Objective: Patient is well-developed, well-nourished in no acute distress.  Resting comfortably  at home.  Head is normocephalic, atraumatic.  No labored breathing.  Speech is clear and coherent with logical content.  Patient is alert and oriented at baseline.  Cough  Assessment and Plan:  1. Acute cough Most likely start of bronchitis given Hx and exposure to virus of some kind. COVID test PCR pending with [facility]. No antivirals at this time- symptoms are mild. Will treat start of bronchitis   - predniSONE (STERAPRED UNI-PAK 21 TAB) 10 MG (21) TBPK tablet; Take as directed  Dispense: 21 tablet; Refill: 0 - promethazine-dextromethorphan (PROMETHAZINE-DM) 6.25-15 MG/5ML syrup; Take 2.5 mLs by mouth 4 (four) times daily as needed for cough.  Dispense: 118 mL; Refill: 0   Reviewed side effects, risks and benefits of medication.    Patient acknowledged agreement and understanding of the plan.    I discussed the assessment and treatment plan with the patient. The patient was provided an opportunity to ask questions and all were answered. The patient agreed with the plan and demonstrated an understanding of the instructions.   The patient was advised to call back or seek an in-person evaluation if the symptoms worsen or if the condition fails to improve as anticipated.   The above assessment and management plan was discussed with the patient. The patient verbalized understanding of and has agreed to the  management plan. Patient is aware to call the clinic if symptoms persist or worsen. Patient is aware when to return to the clinic for a follow-up visit. Patient educated on when it is appropriate to go to the emergency department.   Follow Up Instructions: I discussed the assessment and treatment plan with the patient. The patient was provided an opportunity to ask questions and all were answered. The patient agreed with the plan and demonstrated an understanding of the instructions.  A copy of instructions were sent to the patient via MyChart unless otherwise noted below.     The patient was advised to call back or seek an in-person evaluation if the symptoms worsen or if the condition fails to improve as anticipated.  Time:  I spent 15 minutes with the patient via telehealth technology discussing the above problems/concerns.    Freddy Finner, NP

## 2021-12-09 ENCOUNTER — Encounter: Payer: Self-pay | Admitting: Family Medicine

## 2021-12-09 DIAGNOSIS — U071 COVID-19: Secondary | ICD-10-CM

## 2021-12-09 MED ORDER — MOLNUPIRAVIR EUA 200MG CAPSULE: 4.0000 | ORAL_CAPSULE | Freq: Two times a day (BID) | ORAL | 0 refills | Status: AC

## 2021-12-09 NOTE — Addendum Note (Signed)
Addended by: Waldon Merl on: 12/09/2021 02:09 PM   Modules accepted: Orders

## 2021-12-13 DIAGNOSIS — J45901 Unspecified asthma with (acute) exacerbation: Secondary | ICD-10-CM | POA: Diagnosis not present

## 2021-12-13 DIAGNOSIS — U071 COVID-19: Secondary | ICD-10-CM | POA: Diagnosis not present

## 2021-12-13 DIAGNOSIS — J45909 Unspecified asthma, uncomplicated: Secondary | ICD-10-CM | POA: Diagnosis not present

## 2022-01-06 ENCOUNTER — Telehealth: Payer: Self-pay

## 2022-01-06 NOTE — Telephone Encounter (Signed)
Pt calling; has questions about IUDs; which one is best?  Which one has the least side effects? Which one do we recommend?  [phone]

## 2022-01-08 NOTE — Telephone Encounter (Signed)
LMVM to notify patient. 

## 2022-01-08 NOTE — Telephone Encounter (Signed)
Pls let pt know I'm out of office till Monday. She should research Mirena vs paragard IUDs. Mirena has hormones and period improvement. Paragard is just hormone free birth control and no relief of periods. She should schedule IUD insertion appt with menses and we can make final decision at her appt since we keep both in stock. Or, she can call me back next week after she has researched them. Thx.

## 2022-03-22 DIAGNOSIS — Z20822 Contact with and (suspected) exposure to covid-19: Secondary | ICD-10-CM | POA: Diagnosis not present

## 2022-03-22 DIAGNOSIS — R0989 Other specified symptoms and signs involving the circulatory and respiratory systems: Secondary | ICD-10-CM | POA: Diagnosis not present

## 2022-03-22 DIAGNOSIS — J0191 Acute recurrent sinusitis, unspecified: Secondary | ICD-10-CM | POA: Diagnosis not present

## 2022-03-24 ENCOUNTER — Ambulatory Visit
Admission: RE | Admit: 2022-03-24 | Discharge: 2022-03-24 | Disposition: A | Discharge status: Home / Self Care | Payer: 59 | Source: Ambulatory Visit | Attending: Advanced Practice Midwife | Admitting: Advanced Practice Midwife

## 2022-03-24 DIAGNOSIS — Z01419 Encounter for gynecological examination (general) (routine) without abnormal findings: Secondary | ICD-10-CM | POA: Diagnosis not present

## 2022-03-24 DIAGNOSIS — Z1231 Encounter for screening mammogram for malignant neoplasm of breast: Secondary | ICD-10-CM | POA: Diagnosis not present

## 2022-03-24 DIAGNOSIS — Z1239 Encounter for other screening for malignant neoplasm of breast: Secondary | ICD-10-CM | POA: Diagnosis present

## 2022-03-29 ENCOUNTER — Telehealth: Payer: Self-pay

## 2022-03-29 NOTE — Telephone Encounter (Signed)
Patient is scheduled for IUD placement mirnea vs paraguard on 03/31/22 at 4:26 pm with [facility]

## 2022-03-31 ENCOUNTER — Ambulatory Visit: Payer: 59 | Admitting: Obstetrics and Gynecology

## 2022-03-31 NOTE — Progress Notes (Signed)
? ?  Chief Complaint  ?Patient presents with  ? Contraception  ?  IUD insertion  ? ? ? ?IUD PROCEDURE NOTE: ? ?Pamela Hamilton is a 41 y.o. [phone] here for Mirena  IUD insertion for [facility], wants non-daily method. On OCPs currently.  ? ?Pelvic pain from 12/22 resolved except once since then.  ? ?BP 110/80   Ht 5' 3.5" (1.613 m)   Wt 201 lb (91.2 kg)   LMP 03/28/2022 (Exact Date)   BMI 35.05 kg/m?  ? ?IUD Insertion Procedure Note ?Patient identified, informed consent performed, consent signed.   Discussed risks of irregular bleeding, cramping, infection, malpositioning or misplacement of the IUD outside the uterus which may require further procedure such as laparoscopy, risk of failure <1%. Time out was performed.   ? ?Speculum placed in the vagina.  Cervix visualized.  Cleaned with Betadine x 2.  Grasped anteriorly with a single tooth tenaculum.  Uterus sounded to 8.5 cm.   IUD placed per manufacturer's recommendations.  Strings trimmed to 3 cm. Tenaculum was removed, good hemostasis noted.  Patient tolerated procedure well.  ? ?ASSESSMENT: ? ?Encounter for insertion of intrauterine contraceptive device (IUD) - Plan: levonorgestrel (MIRENA) 20 MCG/DAY IUD ? ? ?Meds ordered this encounter  ?Medications  ? levonorgestrel (MIRENA) 20 MCG/DAY IUD  ?  Sig: 1 each by Intrauterine route once for 1 dose.  ?  Dispense:  1 each  ?  Refill:  0  ?  Order Specific Question:   Supervising Provider  ?  Answer:   CONSTANT, PEGGY [[id number]]  ? ? ? ?Plan: ? ?Patient was given post-procedure instructions.  She was advised to have backup contraception for one week.   ?Call if you are having increasing pain, cramps or bleeding or if you have a fever greater than 100.4 degrees F., shaking chills, nausea or vomiting. ?Patient was also asked to check IUD strings periodically and follow up in 4 weeks for IUD check. ? ?Return in about 4 weeks (around 04/29/2022) for IUD f/u. ? ?Lord Lancour B. Ifeoluwa Bartz, PA-C ?04/01/2022 ?9:37 AM ? ? ?  ?

## 2022-04-01 ENCOUNTER — Encounter: Payer: Self-pay | Admitting: Obstetrics and Gynecology

## 2022-04-01 ENCOUNTER — Ambulatory Visit (INDEPENDENT_AMBULATORY_CARE_PROVIDER_SITE_OTHER): Payer: 59 | Admitting: Obstetrics and Gynecology

## 2022-04-01 VITALS — BP 110/80 | Ht 63.5 in | Wt 201.0 lb

## 2022-04-01 DIAGNOSIS — Z30430 Encounter for insertion of intrauterine contraceptive device: Secondary | ICD-10-CM | POA: Diagnosis not present

## 2022-04-01 MED ORDER — LEVONORGESTREL 20 MCG/DAY IU IUD: 1.0000 | INTRAUTERINE_SYSTEM | Freq: Once | INTRAUTERINE | 0 refills | Status: AC

## 2022-04-01 NOTE — Telephone Encounter (Signed)
Noted. Mirena received/charged 04/01/2022 ?

## 2022-04-01 NOTE — Patient Instructions (Signed)
I value your feedback and you entrusting us with your care. If you get a [facility] patient survey, I would appreciate you taking the time to let us know about your experience today. Thank you!  [facility] [phone]  Instructions after IUD insertion  Most women experience no significant problems after insertion of an IUD, however minor cramping and spotting for a few days is common. Cramps may be treated with ibuprofen 800mg every 8 hours or Tylenol 650 mg every 4 hours. Contact [facility] immediately if you experience any of the following symptoms during the next week: temperature >99.6 degrees, worsening pelvic pain, abdominal pain, fainting, unusually heavy vaginal bleeding, foul vaginal discharge, or if you think you have expelled the IUD.  Nothing inserted in the vagina for 48 hours. You will be scheduled for a follow up visit in approximately four weeks.  You should check monthly to be sure you can feel the IUD strings in the upper vagina. If you are having a monthly period, try to check after each period. If you cannot feel the IUD strings,  contact [facility] immediately so we can do an exam to determine if the IUD has been expelled.   Please use backup protection until we can confirm the IUD is in place.  Call [facility] if you are exposed to or diagnosed with a sexually transmitted infection, as we will need to discuss whether it is safe for you to continue using an IUD.   

## 2022-04-22 ENCOUNTER — Ambulatory Visit (INDEPENDENT_AMBULATORY_CARE_PROVIDER_SITE_OTHER): Payer: 59 | Admitting: Obstetrics and Gynecology

## 2022-04-22 ENCOUNTER — Encounter: Payer: Self-pay | Admitting: Obstetrics and Gynecology

## 2022-04-22 VITALS — BP 120/82 | Ht 63.0 in | Wt 203.0 lb

## 2022-04-22 DIAGNOSIS — Z30431 Encounter for routine checking of intrauterine contraceptive device: Secondary | ICD-10-CM | POA: Diagnosis not present

## 2022-04-22 DIAGNOSIS — K6289 Other specified diseases of anus and rectum: Secondary | ICD-10-CM | POA: Diagnosis not present

## 2022-04-22 DIAGNOSIS — R102 Pelvic and perineal pain: Secondary | ICD-10-CM

## 2022-04-22 NOTE — Patient Instructions (Signed)
I value your feedback and you entrusting us with your care. If you get a [facility] patient survey, I would appreciate you taking the time to let us know about your experience today. Thank you! ? ? ?

## 2022-04-22 NOTE — Progress Notes (Signed)
Chief Complaint  Patient presents with   Gynecologic Exam     History of Present Illness:  Pamela Hamilton is a 41 y.o. that had a Mirena IUD placed approximately 3 wks ago. Since that time, she denies dyspareunia, vaginal d/c, heavy bleeding. Has had spotting intermittently since placement, no dysmen. Hasn't been sex active since placement. Was on OCPs, wanted non-daily method.  Pt with hx of infrequent pelvic pain, eval 12/22 with neg u/s, sx resolved. Pt noted episode of pelvic cramping 2 days ago with rectal pain/pressure. Sx intense and pt felt pre-syncopal. Laid down for about 5-10 min then felt better. No BM at that time, no diarrhea. Had normal BM later, no bleeding. Menses due next wk.  11/30/21 NOTE: severe suprapubic pain that occurred about 6 days ago while sitting at work. Pt then had significant pressure with standing. Sx lasted about 15 minutes then eased up, then resolved. No vag, urin, GI sx with sx. Pt did self exam and thought she felt bulge vaginally.  Pelvic pressure sx then recurred yesterday but wasn't as bad as initially, and no pain. Again, no vag, urin, GI sx. Pt does lifting of pts at work but wasn't lifting prior to sx start. Feels like she has issues with pelvic relaxation and has done kegels in past, but doing more frequently now. Not sure if muscles are strong enough.  Pt has issues with straining for BMs but doesn't feel like stools are hard. Hasn't tried fiber.   Review of Systems  Constitutional:  Negative for fever.  Gastrointestinal:  Positive for rectal pain. Negative for blood in stool, constipation, diarrhea, nausea and vomiting.  Genitourinary:  Positive for pelvic pain. Negative for dyspareunia, dysuria, flank pain, frequency, hematuria, urgency, vaginal bleeding, vaginal discharge and vaginal pain.  Musculoskeletal:  Negative for back pain.  Skin:  Negative for rash.   Physical Exam:  BP 120/82   Ht 5\' 3"  (1.6 m)   Wt 203 lb (92.1 kg)   LMP  03/28/2022 (Exact Date)   BMI 35.96 kg/m  Body mass index is 35.96 kg/m.  Pelvic exam:  Physical Exam Constitutional:      General: She is not in acute distress. Genitourinary:     Vulva normal.     Right Labia: No rash, tenderness or lesions.    Left Labia: No tenderness, lesions or rash.    No vaginal discharge, erythema, tenderness or bleeding.      Right Adnexa: not tender and no mass present.    Left Adnexa: not tender and no mass present.    No cervical motion tenderness or friability.     IUD strings visualized.     Uterus is tender.     Uterus is not enlarged.  Pulmonary:     Effort: Pulmonary effort is normal.  Musculoskeletal:        General: Normal range of motion.  Neurological:     General: No focal deficit present.     Mental Status: She is alert.     Cranial Nerves: No cranial nerve deficit.  Skin:    General: Skin is warm and dry.  Psychiatric:        Mood and Affect: Mood normal.        Behavior: Behavior normal.        Thought Content: Thought content normal.        Judgment: Judgment normal.  Vitals and nursing note reviewed.     Assessment:   Encounter for routine  checking of intrauterine contraceptive device (IUD)--IUD strings in cx os. Doing well.   Rectal pain--1 episode 2 days ago, question spasm. F/u prn.   Pelvic cramping--2 days ago with rectal pain. Tender on exam. Question IUD vs other. F/u prn.    Plan: F/u if any signs of infection or can no longer feel the strings.   Vaneza Pickart B. Sloane Junkin, PA-C 04/22/2022 5:23 PM

## 2022-05-23 DIAGNOSIS — R051 Acute cough: Secondary | ICD-10-CM | POA: Diagnosis not present

## 2022-05-23 DIAGNOSIS — J069 Acute upper respiratory infection, unspecified: Secondary | ICD-10-CM | POA: Diagnosis not present

## 2022-05-23 DIAGNOSIS — Z03818 Encounter for observation for suspected exposure to other biological agents ruled out: Secondary | ICD-10-CM | POA: Diagnosis not present

## 2022-06-14 DIAGNOSIS — R053 Chronic cough: Secondary | ICD-10-CM | POA: Diagnosis not present

## 2022-06-21 ENCOUNTER — Telehealth: Payer: Self-pay

## 2022-06-21 NOTE — Telephone Encounter (Signed)
Called Soumya and left voicemail to return my call.

## 2022-06-21 NOTE — Telephone Encounter (Signed)
Pamela Hamilton called triage line stating shes found another lump in breast and needed to know if she needed to come here to be seen first or just call the imaging center to get scheduled.

## 2022-06-22 NOTE — Telephone Encounter (Signed)
Pt returning a missed call from yesterday; possible breast cyst.  [phone]  pt states it's on the top of her right breast; nickel sized; found it yesterday; 4-6/10 on pain scale; pain radiates to back of right shoulder blade; no skin changes.  Adv pt needs to be seen within the week; adv per new protocols; tx'd to AM for scheduling.

## 2022-07-02 ENCOUNTER — Ambulatory Visit (INDEPENDENT_AMBULATORY_CARE_PROVIDER_SITE_OTHER): Payer: 59 | Admitting: Obstetrics and Gynecology

## 2022-07-02 ENCOUNTER — Encounter: Payer: Self-pay | Admitting: Obstetrics and Gynecology

## 2022-07-02 VITALS — BP 100/70 | Ht 63.5 in | Wt 203.0 lb

## 2022-07-02 DIAGNOSIS — N6312 Unspecified lump in the right breast, upper inner quadrant: Secondary | ICD-10-CM | POA: Diagnosis not present

## 2022-07-02 DIAGNOSIS — Z808 Family history of malignant neoplasm of other organs or systems: Secondary | ICD-10-CM | POA: Diagnosis not present

## 2022-07-02 DIAGNOSIS — Z803 Family history of malignant neoplasm of breast: Secondary | ICD-10-CM | POA: Diagnosis not present

## 2022-07-02 NOTE — Progress Notes (Signed)
[place], [place]   Chief Complaint  Patient presents with   Breast Exam    Lump on RB, tender x 2 weeks     HPI:      [name] is a 41 y.o. [id number] whose LMP was Patient's last menstrual period was 06/20/2022 (approximate)., presents today for RT breast mass for the past 2 wks. Sx started just after menses and was tender; has decreased in size slightly and is now less tender. Can feel better in sitting position. No erythema, nipple d/c, trauma. Hx of breast cysts in past. Neg mammo 4/23. FH breast cancer in mat aunt, pat cousin and 2 pat grt aunts, genetic testing not done  Mirena placed 4/23  Patient Active Problem List   Diagnosis Date Noted   Family history of breast cancer 07/02/2022   Family history of premature coronary artery disease 04/27/2018   Mixed hyperlipidemia 04/27/2018    Past Surgical History:  Procedure Laterality Date   BREAST CYST ASPIRATION Right 2008   BREAST CYST ASPIRATION Right 2009   DILATION AND CURETTAGE OF UTERUS     EYE SURGERY     X 3   SHOULDER ARTHROSCOPY Right    TONSILLECTOMY N/A 07/13/2018   Procedure: TONSILLECTOMY;  Surgeon: Pamela Manner, MD;  Location: [facility] ORS;  Service: ENT;  Laterality: N/A;   TONSILLECTOMY     WISDOM TOOTH EXTRACTION      Family History  Problem Relation Age of Onset   Hypertension Mother    Hyperlipidemia Mother    Diabetes Mother    Heart disease Father    CAD Father    Hyperlipidemia Father    Breast cancer Maternal Aunt 72   Diabetes Maternal Grandmother    Hypertension Maternal Grandmother    Heart disease Paternal Grandmother    Heart disease Paternal Grandfather    Breast cancer Cousin 78   Heart disease Other    Breast cancer Other    Breast cancer Other     Social History   Socioeconomic History   Marital status: Married    Spouse name: Not on file   Number of children: Not on file   Years of education: Not on file   Highest education level: Not on file   Occupational History   Not on file  Tobacco Use   Smoking status: Never   Smokeless tobacco: Never  Vaping Use   Vaping Use: Never used  Substance and Sexual Activity   Alcohol use: Yes    Comment: OCC   Drug use: No   Sexual activity: Yes    Birth control/protection: I.U.D.    Comment: Mirena  Other Topics Concern   Not on file  Social History Narrative   Not on file   Social Determinants of Health   Financial Resource Strain: Not on file  Food Insecurity: Not on file  Transportation Needs: Not on file  Physical Activity: Not on file  Stress: Not on file  Social Connections: Not on file  Intimate Partner Violence: Not on file    Outpatient Medications Prior to Visit  Medication Sig Dispense Refill   acetaminophen (TYLENOL) 500 MG tablet Take 1,000 mg by mouth 2 (two) times daily as needed for moderate pain or headache.     albuterol (VENTOLIN HFA) 108 (90 Base) MCG/ACT inhaler Inhale 2 puffs into the lungs every 6 (six) hours as needed for wheezing or shortness of breath.      FLOVENT HFA 110 MCG/ACT  inhaler Inhale 2 puffs into the lungs 2 (two) times daily.     fluticasone (FLONASE) 50 MCG/ACT nasal spray Place 2 sprays into both nostrils daily. 16 g 6   Hypromellose (ARTIFICIAL TEARS OP) Place 1 drop into both eyes daily as needed (dry eyes).     Menthol, Topical Analgesic, (ICY HOT EX) Apply 1 application topically daily as needed (pain).     Multiple Vitamin (MULTI-VITAMINS) TABS Take 1 tablet by mouth every evening.      Multiple Vitamins-Minerals (ZINC PO) Take by mouth.     Omega-3 Fatty Acids (FISH OIL ULTRA) 1400 MG CAPS Take 1,400 mg by mouth every evening.     sodium chloride (OCEAN) 0.65 % SOLN nasal spray Place 1 spray into both nostrils as needed for congestion.     vitamin C (ASCORBIC ACID) 500 MG tablet Take 500 mg by mouth every evening.     levonorgestrel (MIRENA) 20 MCG/DAY IUD 1 each by Intrauterine route once for 1 dose. 1 each 0    Norgestimate-Ethinyl Estradiol Triphasic (TRI-SPRINTEC) 0.18/0.215/0.25 MG-35 MCG tablet Take 1 tablet by mouth once daily 90 tablet 3   promethazine-dextromethorphan (PROMETHAZINE-DM) 6.25-15 MG/5ML syrup Take 2.5 mLs by mouth 4 (four) times daily as needed for cough. 118 mL 0   No facility-administered medications prior to visit.      ROS:  Review of Systems  Constitutional:  Negative for fever.  Gastrointestinal:  Negative for blood in stool, constipation, diarrhea, nausea and vomiting.  Genitourinary:  Negative for dyspareunia, dysuria, flank pain, frequency, hematuria, urgency, vaginal bleeding, vaginal discharge and vaginal pain.  Musculoskeletal:  Negative for back pain.  Skin:  Negative for rash.   BREAST: mass/tenderness   OBJECTIVE:   Vitals:  BP 100/70   Ht 5' 3.5" (1.613 m)   Wt 203 lb (92.1 kg)   LMP 06/20/2022 (Approximate)   BMI 35.40 kg/m   Physical Exam Vitals reviewed.  Pulmonary:     Effort: Pulmonary effort is normal.  Chest:  Breasts:    Breasts are symmetrical.     Right: No inverted nipple, mass, nipple discharge, skin change or tenderness.     Left: No inverted nipple, mass, nipple discharge, skin change or tenderness.    Musculoskeletal:        General: Normal range of motion.     Cervical back: Normal range of motion.  Skin:    General: Skin is warm and dry.  Neurological:     General: No focal deficit present.     Mental Status: She is alert and oriented to person, place, and time.     Cranial Nerves: No cranial nerve deficit.  Psychiatric:        Mood and Affect: Mood normal.        Behavior: Behavior normal.        Thought Content: Thought content normal.        Judgment: Judgment normal.     Assessment/Plan: Mass of upper inner quadrant of right breast - Plan: US BREAST LTD UNI RIGHT INC AXILLA, MM DIAG BREAST TOMO UNI RIGHT; RT breast for 2 wks, check dx mammo and u/s. Will f/u with results.   Family history of breast cancer  - Plan: [facility] ([place]); My Risk testing discussed and done today. Handout given. Will f/u with results.     Return if symptoms worsen or fail to improve.  Pamela Hamilton B. Pamela Teater, PA-C 07/02/2022 2:35 PM

## 2022-07-06 DIAGNOSIS — Z1371 Encounter for nonprocreative screening for genetic disease carrier status: Secondary | ICD-10-CM

## 2022-07-06 HISTORY — DX: Encounter for nonprocreative screening for genetic disease carrier status: Z13.71

## 2022-07-12 IMAGING — MG MM DIGITAL SCREENING BILAT W/ TOMO AND CAD
8 series · 8 of 24 positions shown · non-contrast
Comparison: Previous exam(s).

CLINICAL DATA: Screening. History of breast cysts.

EXAM:
DIGITAL SCREENING BILATERAL MAMMOGRAM WITH TOMOSYNTHESIS AND CAD
TECHNIQUE: Bilateral screening digital craniocaudal and mediolateral oblique
mammograms were obtained. Bilateral screening digital breast
tomosynthesis was performed. The images were evaluated with
computer-aided detection.

[L CC synth-2D]
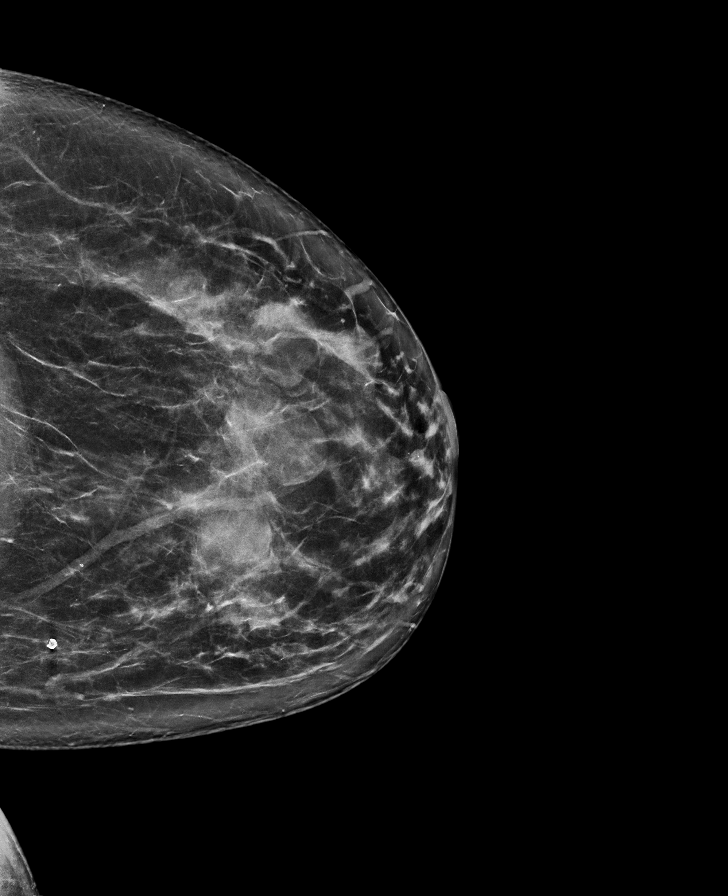

[L MLO synth-2D]
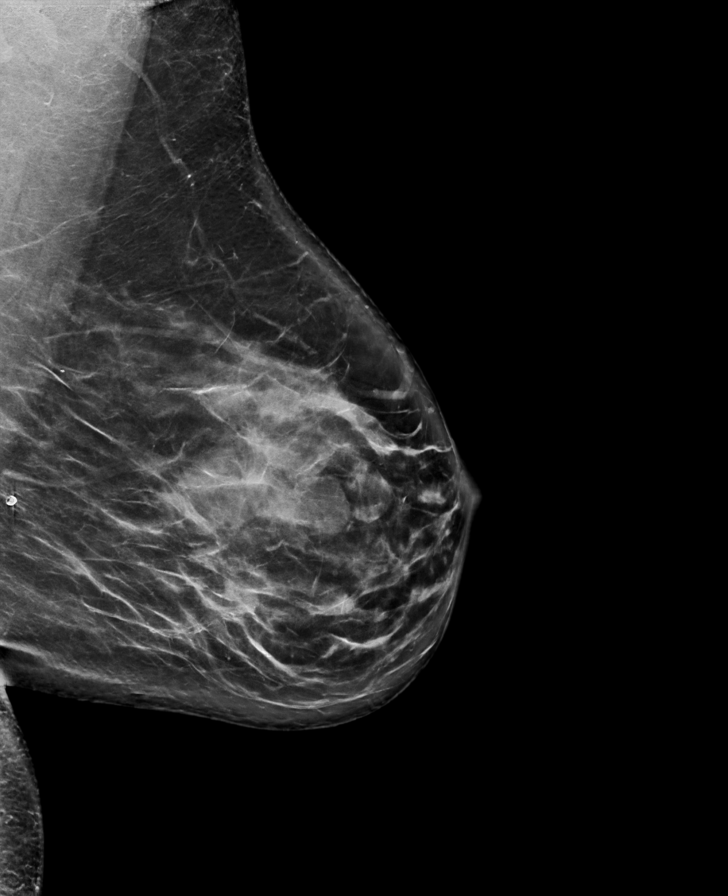

[R CC synth-2D]
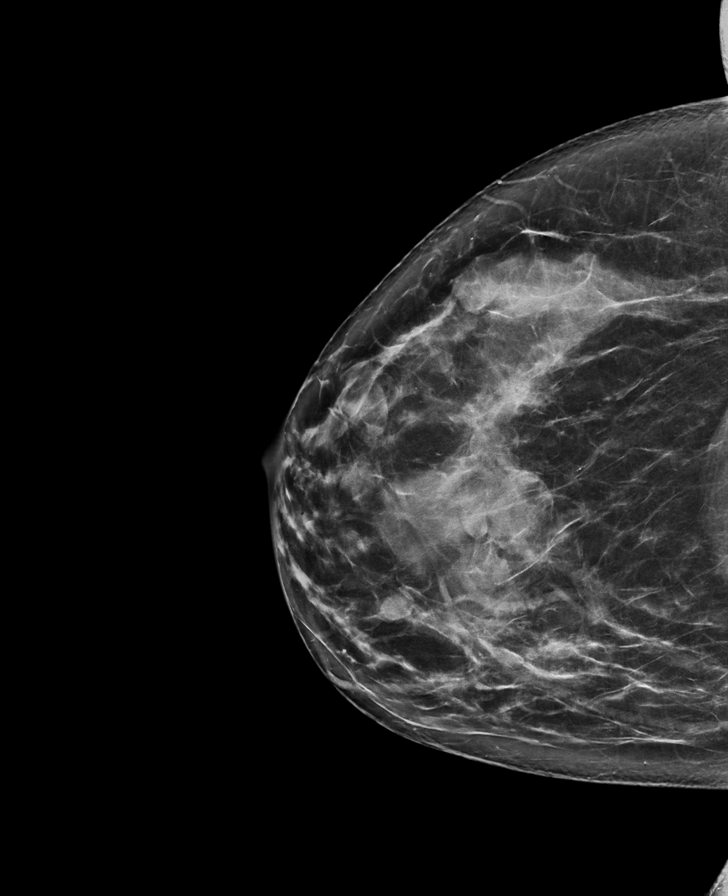

[R MLO synth-2D]
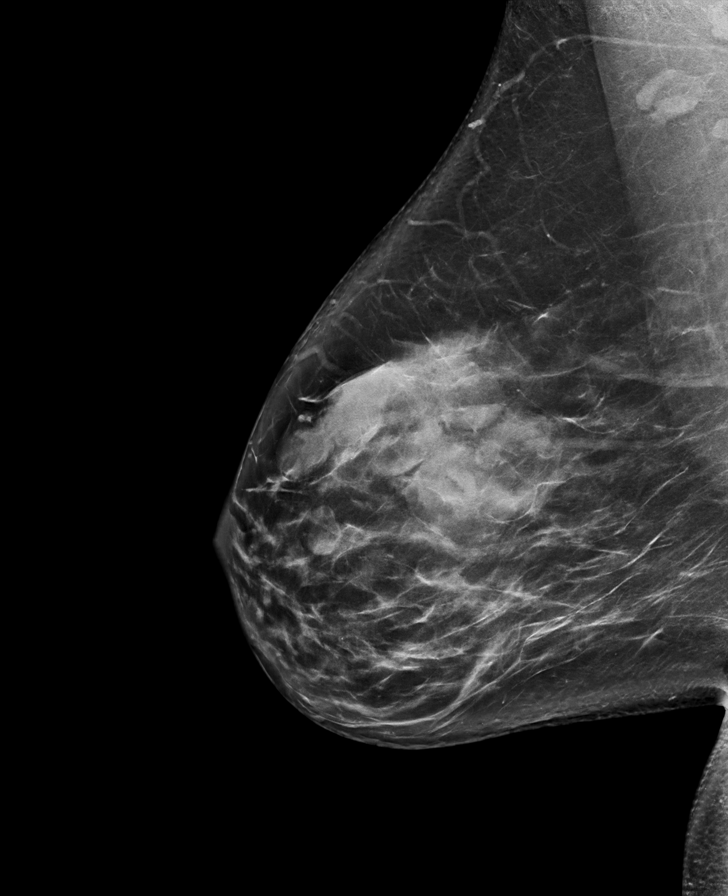

[R MLO tomo · tomo slice 47/92.0]
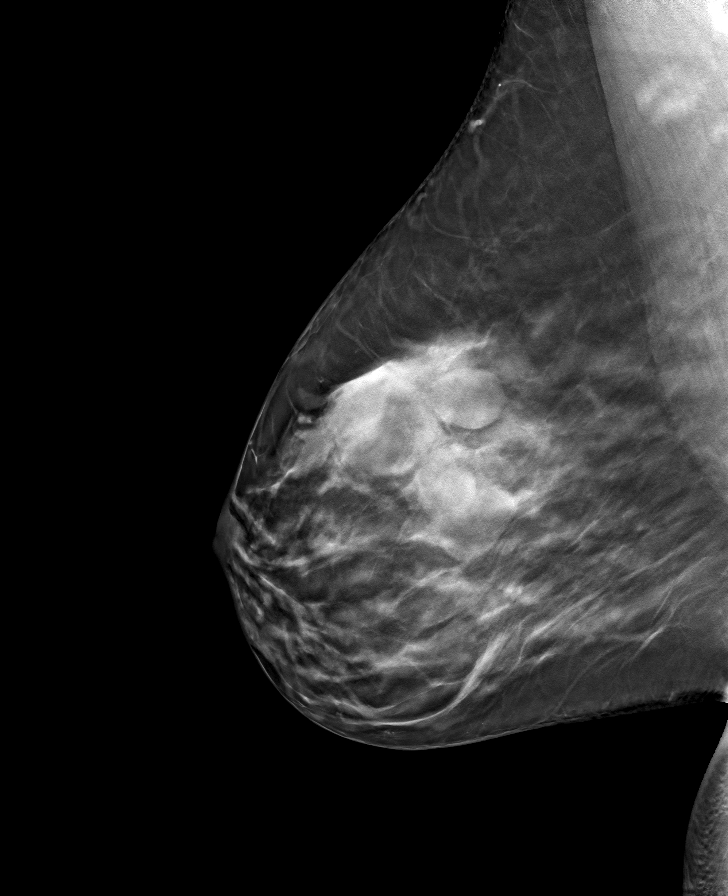

[L CC tomo · tomo slice 41/82.0]
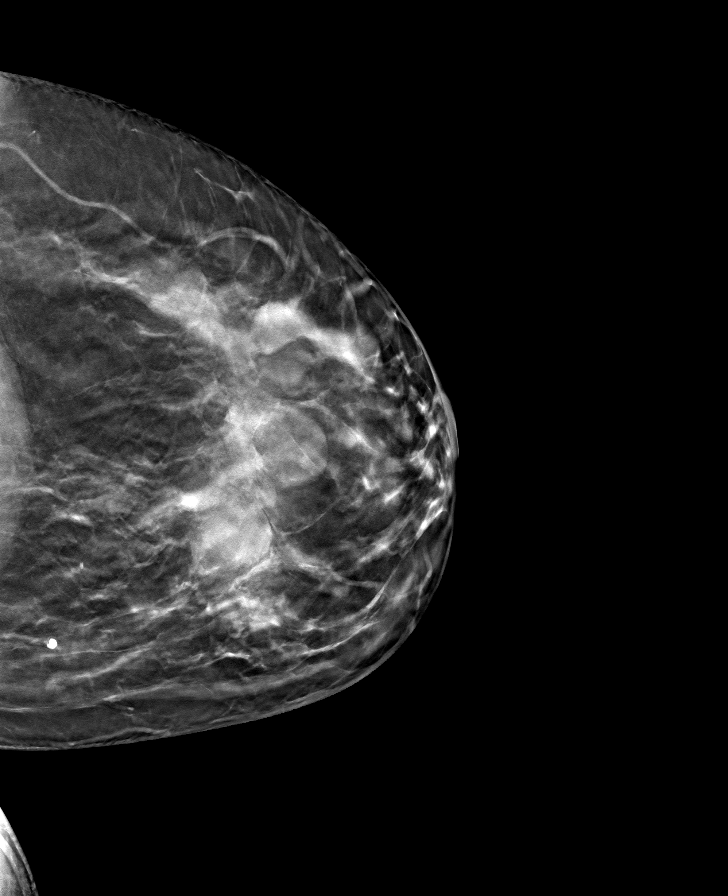

[L MLO tomo · tomo slice 47/94.0]
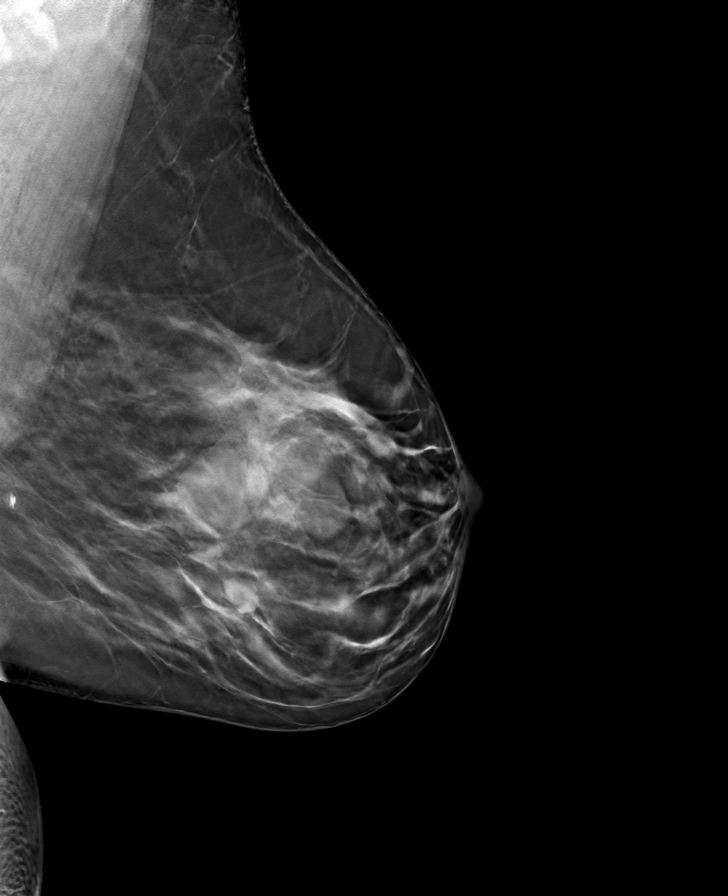

[R CC tomo · tomo slice 43/85.0]
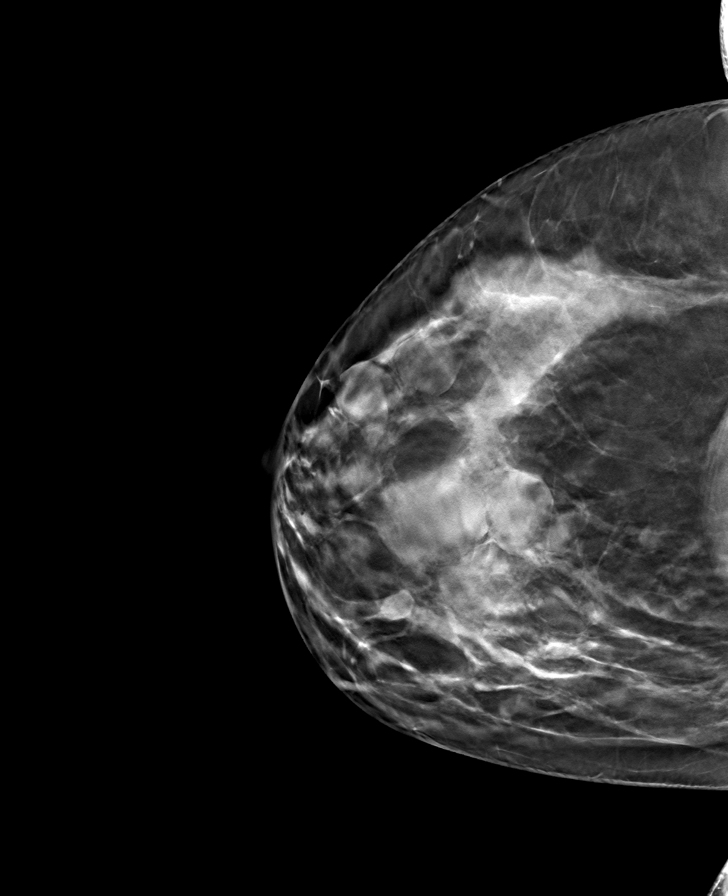

[8 of 24 positions shown; findings below may reference images not displayed]

ACR Breast Density Category c: The breast tissue is heterogeneously
dense, which may obscure small masses.
FINDINGS: There are no findings suspicious for malignancy.
IMPRESSION: No mammographic evidence of malignancy. A result letter of this
screening mammogram will be mailed directly to the patient.

RECOMMENDATION:
Screening mammogram in one year. (Code:2H-W-QR2)

BI-RADS CATEGORY  1: Negative.

## 2022-07-20 ENCOUNTER — Encounter: Payer: Self-pay | Admitting: Obstetrics and Gynecology

## 2022-07-26 ENCOUNTER — Ambulatory Visit
Admission: RE | Admit: 2022-07-26 | Discharge: 2022-07-26 | Disposition: A | Discharge status: Home / Self Care | Payer: 59 | Source: Ambulatory Visit | Attending: Obstetrics and Gynecology | Admitting: Obstetrics and Gynecology

## 2022-07-26 DIAGNOSIS — N6312 Unspecified lump in the right breast, upper inner quadrant: Secondary | ICD-10-CM | POA: Diagnosis not present

## 2022-07-26 DIAGNOSIS — R922 Inconclusive mammogram: Secondary | ICD-10-CM | POA: Diagnosis not present

## 2022-07-26 DIAGNOSIS — N6011 Diffuse cystic mastopathy of right breast: Secondary | ICD-10-CM | POA: Diagnosis not present

## 2022-08-04 ENCOUNTER — Telehealth: Payer: Self-pay

## 2022-08-04 NOTE — Telephone Encounter (Signed)
Pt calling for lab results.  [phone]  Courtesy call to pt that Helmut Muster is not in the office today but is in the office tomorrow.  Pt states she works tomorrow and Friday and doesn't always have her phone on her; she takes a lunch at 3pm or she will reach back to [place] next week.

## 2022-08-05 NOTE — Telephone Encounter (Signed)
LMTRC. 3:06 PM

## 2022-08-05 NOTE — Telephone Encounter (Signed)
Pt called me back. She is aware of neg MyRIsk results except VUS, IBIS=15.5%/riskscore=9.1%. No extra breast screenings recommended. Pt should do yearly derm appts due to FH melanoma in her dad.  Patient understands these results only apply to her and her children, and this is not indicative of genetic testing results of her other family members. It is recommended that her other family members have genetic testing done.  Pt also understands negative genetic testing doesn't mean she will never get any of these cancers.   Hard copy mailed to pt. F/u prn.

## 2022-08-13 ENCOUNTER — Other Ambulatory Visit: Payer: Self-pay

## 2022-11-16 ENCOUNTER — Telehealth: Payer: Self-pay

## 2022-11-16 ENCOUNTER — Ambulatory Visit: Payer: Commercial Managed Care - PPO

## 2022-11-16 DIAGNOSIS — M25572 Pain in left ankle and joints of left foot: Secondary | ICD-10-CM

## 2022-11-16 NOTE — Therapy (Signed)
PT Screening Form   Time: in__1146__am__     Time out_1207_pm___   Complaint __L ankle sprain_with hx of multiple ankle sprains_ Past Medical Hx:  __via chart: arthritis, migraines, hypothyroidism Injury Date:__12/11/2023______________________________  Pain Scale: __ _reports currently has L ankle pain_________ Patient's phone number:   Hx (this occurrence):  Pt reports hx of multiple ankle sprains. She reports she hurt her L ankle yesterday. Pt reports she was power-walking trying to complete 5 miles, she turned to look behind her and then stepped on a gumball and reports an inversion motion with a twist of L ankle and fell onto her RLE.   She reports she is elevating her LLE at home, and using ice and ibuoprofen. Pt reports "dorsiflexion and plantarflexion is pretty good." She reports landing on back of her heel is painful. She is currently ambulating using crutches provided to her for a previous sprain to offload LLE and is wearing B ankle braces. She is careful not to put full pressure on LLE due to pain and concern of further injury, but reports she is able to toe-touch. She reports just 1 fall in past year but has stumbled and rolled her ankles several times.    Assessment:  Integumentary Skin around L lateral malleolus notably swollen but no redness present.   Palpation: Pt TTP over L lateral malleolus and medial malleolus, however more painful lateral>medial  Anterior drawer- slight increased in laxity of L compared to R and painful  Talar Tilt test: similar motion each side, however painful on L with guarding  PROM - R vs L: L is pain-limited - most painful with PF, R WFL AROM- R vs L: L is pain-limited - most painful with PF, R WFL Strength: strength testing of L ankle limited due to pain, however, noticeable weakness compared to R ankle musculature     Recommendations:   PT recommends pt see physician due to results of testing and pt history; PT also recommends XRAYS at  this time due to symptoms and testing, with inability to fully weight-bear through LLE.  Pt should continue use of elevation, ice and rest in meantime until evaluated by MD.  PT provides handout for HEP in session. However, PT then contacts pt via secure phone following session and instructed pt only to perform the following interventions once seen and cleared by a physician to do so. Pt verbalized understanding: Access Code: [email or web address].  URL: [email or web address] Date: 11/16/2022 Prepared by: Temple Pacini  Exercises - Supine Ankle Pumps  - 1 x daily - 7 x weekly - 2 sets - 15 reps - Seated Ankle Alphabet  - 1 x daily - 7 x weekly - 1 sets - 1 reps Comments:    [x]  Patient would benefit from an MD referral [x]  Patient would benefit from a full PT/OT/ SLP evaluation and treatment once cleared by MD []  No intervention recommended at this time.   PT, DPT

## 2022-11-16 NOTE — Telephone Encounter (Signed)
PT contacted pt over secure phone line following screen today to instruct pt not to perform HEP provided until pt seen by an MD and cleared to do so. Pt verbalized understanding.  Temple Pacini PT, DPT

## 2023-07-26 ENCOUNTER — Other Ambulatory Visit (HOSPITAL_COMMUNITY): Admission: RE | Admit: 2023-07-26 | Payer: 59 | Source: Ambulatory Visit | Admitting: Obstetrics & Gynecology

## 2023-07-26 ENCOUNTER — Encounter: Payer: Self-pay | Admitting: Obstetrics & Gynecology

## 2023-07-26 ENCOUNTER — Ambulatory Visit (INDEPENDENT_AMBULATORY_CARE_PROVIDER_SITE_OTHER): Payer: 59 | Admitting: Obstetrics & Gynecology

## 2023-07-26 VITALS — BP 120/72 | HR 76 | Ht 63.5 in | Wt 202.0 lb

## 2023-07-26 DIAGNOSIS — Z0000 Encounter for general adult medical examination without abnormal findings: Secondary | ICD-10-CM | POA: Diagnosis not present

## 2023-07-26 DIAGNOSIS — Z1239 Encounter for other screening for malignant neoplasm of breast: Secondary | ICD-10-CM

## 2023-07-26 DIAGNOSIS — Z01419 Encounter for gynecological examination (general) (routine) without abnormal findings: Secondary | ICD-10-CM

## 2023-07-26 DIAGNOSIS — Z124 Encounter for screening for malignant neoplasm of cervix: Secondary | ICD-10-CM | POA: Insufficient documentation

## 2023-07-26 NOTE — Progress Notes (Signed)
GYNECOLOGY ANNUAL PHYSICAL EXAM PROGRESS NOTE  Subjective:    Pamela Hamilton is a 42 y.o. [id number] (10 and 50 yo daughters)  who presents for an annual exam. The patient has no complaints today. She is happy with her Mirena, only has occasional spotting. The patient is sexually active. The patient participates in regular exercise: walks.  Has the patient ever been transfused or tattooed?: no. The patient reports that there is not domestic violence in her life.    Menstrual History:  No LMP recorded. (Menstrual status: IUD). Period Pattern: (!) Irregular Menstrual Flow: Light   Gynecologic History:  Contraception: IUD History of STI's:  Last Pap: 2022.  Results were: normal.  Denies h/o abnormal pap smears. Last mammogram: 07/2022.   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   Upstream - 07/26/23 0853       Pregnancy Intention Screening   Does the patient want to become pregnant in the next year? No    Does the patient's partner want to become pregnant in the next year? No    Would the patient like to discuss contraceptive options today? No      Contraception Wrap Up   Current Method IUD or IUS    End Method IUD or IUS    Contraception Counseling Provided No               OB History  Gravida Para Term Preterm AB Living  3 2 2  0 1 2  SAB IAB Ectopic Multiple Live Births  1 0 0 0 2    # Outcome Date GA Lbr Len/2nd Weight Sex Type Anes PTL Lv  3 Term 12/01/12    F Vag-Spont   LIV  2 Term 02/27/10    F Vag-Spont   LIV  1 SAB             Past Medical History:  Diagnosis Date   Abortion, missed    Anemia    Arthritis    Asthma    EXERCISE INDUCED   BRCA negative 07/2022   MyRisk neg except MSH3 VUS   Family history of breast cancer    genetic testing letter sent 3/19, 7/20; 8/23 IBIS=15.5%/riskscore=9.1%   GERD (gastroesophageal reflux disease)    OCC NO MEDS   Headache    MIGRAINES   History of methicillin  resistant staphylococcus aureus (MRSA) 02/2018   NASAL SWAB    History of strep sore throat    Hypothyroidism     Past Surgical History:  Procedure Laterality Date   BREAST CYST ASPIRATION Right 2008   BREAST CYST ASPIRATION Right 2009   DILATION AND CURETTAGE OF UTERUS     EYE SURGERY     X 3   SHOULDER ARTHROSCOPY Right    TONSILLECTOMY N/A 07/13/2018   Procedure: TONSILLECTOMY;  Surgeon: Bud Face, MD;  Location: [facility];  Service: ENT;  Laterality: N/A;   TONSILLECTOMY     WISDOM TOOTH EXTRACTION      Family History  Problem Relation Age of Onset   Hypertension Mother    Hyperlipidemia Mother    Diabetes Mother    Heart disease Father    CAD Father    Hyperlipidemia Father    Breast cancer Maternal Aunt 71   Diabetes Maternal Grandmother    Hypertension Maternal Grandmother    Heart disease Paternal Grandmother    Heart disease Paternal Grandfather    Breast cancer  Cousin 35   Heart disease Other    Breast cancer Other    Breast cancer Other     Social History   Socioeconomic History   Marital status: Married    Spouse name: Not on file   Number of children: Not on file   Years of education: Not on file   Highest education level: Not on file  Occupational History   Not on file  Tobacco Use   Smoking status: Never   Smokeless tobacco: Never  Vaping Use   Vaping status: Never Used  Substance and Sexual Activity   Alcohol use: Yes    Comment: OCC   Drug use: No   Sexual activity: Yes    Birth control/protection: I.U.D.    Comment: Mirena  Other Topics Concern   Not on file  Social History Narrative   Not on file   Social Determinants of Health   Financial Resource Strain: Not on file  Food Insecurity: Not on file  Transportation Needs: Not on file  Physical Activity: Not on file  Stress: Not on file  Social Connections: Not on file  Intimate Partner Violence: Not on file    Current Outpatient Medications on File Prior to Visit   Medication Sig Dispense Refill   acetaminophen (TYLENOL) 500 MG tablet Take 1,000 mg by mouth 2 (two) times daily as needed for moderate pain or headache.     albuterol (VENTOLIN HFA) 108 (90 Base) MCG/ACT inhaler Inhale 2 puffs into the lungs every 6 (six) hours as needed for wheezing or shortness of breath.      FLOVENT HFA 110 MCG/ACT inhaler Inhale 2 puffs into the lungs 2 (two) times daily.     fluticasone (FLONASE) 50 MCG/ACT nasal spray Place 2 sprays into both nostrils daily. 16 g 6   Hypromellose (ARTIFICIAL TEARS OP) Place 1 drop into both eyes daily as needed (dry eyes).     Menthol, Topical Analgesic, (ICY HOT EX) Apply 1 application topically daily as needed (pain).     Multiple Vitamin (MULTI-VITAMINS) TABS Take 1 tablet by mouth every evening.      Multiple Vitamins-Minerals (ZINC PO) Take by mouth.     Omega-3 Fatty Acids (FISH OIL ULTRA) 1400 MG CAPS Take 1,400 mg by mouth every evening.     sodium chloride (OCEAN) 0.65 % SOLN nasal spray Place 1 spray into both nostrils as needed for congestion.     vitamin C (ASCORBIC ACID) 500 MG tablet Take 500 mg by mouth every evening.     levonorgestrel (MIRENA) 20 MCG/DAY IUD 1 each by Intrauterine route once for 1 dose. 1 each 0   [DISCONTINUED] loratadine (CLARITIN) 10 MG tablet Take 10 mg by mouth daily as needed for allergies.     No current facility-administered medications on file prior to visit.    Allergies  Allergen Reactions   Versed [Midazolam]     PT STATES SHE BECOMES VERY DISORIENTED AND LOOPY     Review of Systems Constitutional: negative for chills, fatigue, fevers and sweats Eyes: negative for irritation, redness and visual disturbance Ears, nose, mouth, throat, and face: negative for hearing loss, nasal congestion, snoring and tinnitus Respiratory: negative for asthma, cough, sputum Cardiovascular: negative for chest pain, dyspnea, exertional chest pressure/discomfort, irregular heart beat, palpitations and  syncope Gastrointestinal: negative for abdominal pain, change in bowel habits, nausea and vomiting Genitourinary: negative for abnormal menstrual periods, genital lesions, sexual problems and vaginal discharge, dysuria and urinary incontinence Integument/breast: negative for breast  lump, breast tenderness and nipple discharge Hematologic/lymphatic: negative for bleeding and easy bruising Musculoskeletal:negative for back pain and muscle weakness Neurological: negative for dizziness, headaches, vertigo and weakness Endocrine: negative for diabetic symptoms including polydipsia, polyuria and skin dryness Allergic/Immunologic: negative for hay fever and urticaria      Objective:  Blood pressure 120/72, pulse 76, height 5' 3.5" (1.613 m), weight 202 lb (91.6 kg). Body mass index is 35.22 kg/m.    General Appearance:    Alert, cooperative, no distress, appears stated age  Head:    Normocephalic, without obvious abnormality, atraumatic  Eyes:    PERRL, conjunctiva/corneas clear, EOM's intact, both eyes  Ears:    Normal external ear canals, both ears  Nose:   Nares normal, septum midline, mucosa normal, no drainage or sinus tenderness  Throat:   Lips, mucosa, and tongue normal; teeth and gums normal  Neck:   Supple, symmetrical, trachea midline, no adenopathy; thyroid: no enlargement/tenderness/nodules; no carotid bruit or JVD  Back:     Symmetric, no curvature, ROM normal, no CVA tenderness  Lungs:     Clear to auscultation bilaterally, respirations unlabored  Chest Wall:    No tenderness or deformity   Heart:    Regular rate and rhythm, S1 and S2 normal, no murmur, rub or gallop  Breast Exam:    No tenderness, masses, or nipple abnormality  Abdomen:     Soft, non-tender, bowel sounds active all four quadrants, no masses, no organomegaly.    Genitalia:    Pelvic:external genitalia normal, vagina without lesions, discharge, or tenderness, rectovaginal septum  normal. Cervix normal in  appearance, no cervical motion tenderness, no adnexal masses or tenderness.  Uterus normal size, shape, mobile, regular contours, nontender, retroverted Mirena strings about 1 cm out of os     Extremities:   Extremities normal, atraumatic, no cyanosis or edema  Pulses:   2+ and symmetric all extremities  Skin:   Skin color, texture, turgor normal, no rashes or lesions  Lymph nodes:   Cervical, supraclavicular, and axillary nodes normal  Neurologic:   CNII-XII intact, normal strength, sensation and reflexes throughout   .  Labs:  Lab Results  Component Value Date   WBC 14.1 (H) 12/01/2012   HGB 14.3 07/10/2018   HCT 36.0 12/02/2012   MCV 94 12/01/2012   PLT 146 (L) 12/01/2012    Lab Results  Component Value Date   TSH 5.750 (H) 06/12/2019     Assessment:   1. Well woman exam with routine gynecological exam   2. Breast screening   3. Preventative health care   4. Screening for cervical cancer      Plan:  Fasting blood work today and she will see her fam med provider 09/2023 to discuss results. Mammogram ordered She would like a pap smear, aware of ACOG recs Follow up in 1 year for annual exam   Allie Bossier, MD [facility]

## 2023-07-27 LAB — COMPREHENSIVE METABOLIC PANEL
ALT: 20 IU/L (ref 0–32)
AST: 19 IU/L (ref 0–40)
Albumin: 4.3 g/dL (ref 3.9–4.9)
Alkaline Phosphatase: 73 IU/L (ref 44–121)
BUN/Creatinine Ratio: 8 — ABNORMAL LOW (ref 9–23)
BUN: 8 mg/dL (ref 6–24)
Bilirubin Total: 0.8 mg/dL (ref 0.0–1.2)
CO2: 24 mmol/L (ref 20–29)
Calcium: 9.4 mg/dL (ref 8.7–10.2)
Chloride: 100 mmol/L (ref 96–106)
Creatinine, Ser: 1 mg/dL (ref 0.57–1.00)
Globulin, Total: 2.1 g/dL (ref 1.5–4.5)
Glucose: 96 mg/dL (ref 70–99)
Potassium: 4 mmol/L (ref 3.5–5.2)
Sodium: 139 mmol/L (ref 134–144)
Total Protein: 6.4 g/dL (ref 6.0–8.5)
eGFR: 72 mL/min/{1.73_m2} (ref >59)

## 2023-07-27 LAB — CBC
Hematocrit: 42.8 % (ref 34.0–46.6)
Hemoglobin: 14.5 g/dL (ref 11.1–15.9)
MCH: 31.7 pg (ref 26.6–33.0)
MCHC: 33.9 g/dL (ref 31.5–35.7)
MCV: 93 fL (ref 79–97)
Platelets: 273 10*3/uL (ref 150–450)
RBC: 4.58 x10E6/uL (ref 3.77–5.28)
RDW: 12.7 % (ref 11.7–15.4)
WBC: 8.2 10*3/uL (ref 3.4–10.8)

## 2023-07-27 LAB — LIPID PANEL
Chol/HDL Ratio: 6.2 ratio — ABNORMAL HIGH (ref 0.0–4.4)
Cholesterol, Total: 259 mg/dL — ABNORMAL HIGH (ref 100–199)
HDL: 42 mg/dL (ref >39)
LDL Chol Calc (NIH): 181 mg/dL — ABNORMAL HIGH (ref 0–99)
Triglycerides: 194 mg/dL — ABNORMAL HIGH (ref 0–149)
VLDL Cholesterol Cal: 36 mg/dL (ref 5–40)

## 2023-07-27 LAB — TSH+FREE T4
Free T4: 0.87 ng/dL (ref 0.82–1.77)
TSH: 6.54 u[IU]/mL — ABNORMAL HIGH (ref 0.450–4.500)

## 2023-07-27 LAB — HEMOGLOBIN A1C
Est. average glucose Bld gHb Est-mCnc: 111 mg/dL
Hgb A1c MFr Bld: 5.5 % (ref 4.8–5.6)

## 2023-07-28 LAB — CYTOLOGY - PAP
Comment: NEGATIVE
Diagnosis: NEGATIVE
High risk HPV: NEGATIVE

## 2023-08-01 ENCOUNTER — Encounter: Payer: Self-pay | Admitting: Obstetrics & Gynecology

## 2023-09-13 ENCOUNTER — Ambulatory Visit: Payer: Commercial Managed Care - PPO | Admitting: Family Medicine

## 2023-10-04 ENCOUNTER — Ambulatory Visit (INDEPENDENT_AMBULATORY_CARE_PROVIDER_SITE_OTHER): Payer: 59 | Admitting: Family Medicine

## 2023-10-04 ENCOUNTER — Encounter: Payer: Self-pay | Admitting: Family Medicine

## 2023-10-04 VITALS — BP 110/66 | HR 69 | Ht 63.0 in | Wt 201.0 lb

## 2023-10-04 DIAGNOSIS — Z8249 Family history of ischemic heart disease and other diseases of the circulatory system: Secondary | ICD-10-CM | POA: Diagnosis not present

## 2023-10-04 DIAGNOSIS — R7989 Other specified abnormal findings of blood chemistry: Secondary | ICD-10-CM | POA: Diagnosis not present

## 2023-10-04 DIAGNOSIS — E782 Mixed hyperlipidemia: Secondary | ICD-10-CM

## 2023-10-04 DIAGNOSIS — R41840 Attention and concentration deficit: Secondary | ICD-10-CM | POA: Diagnosis not present

## 2023-10-04 DIAGNOSIS — R1084 Generalized abdominal pain: Secondary | ICD-10-CM | POA: Insufficient documentation

## 2023-10-04 DIAGNOSIS — G4710 Hypersomnia, unspecified: Secondary | ICD-10-CM | POA: Insufficient documentation

## 2023-10-04 DIAGNOSIS — J411 Mucopurulent chronic bronchitis: Secondary | ICD-10-CM | POA: Diagnosis not present

## 2023-10-04 DIAGNOSIS — Z7689 Persons encountering health services in other specified circumstances: Secondary | ICD-10-CM

## 2023-10-04 NOTE — Progress Notes (Signed)
New patient visit   Patient: Pamela Hamilton   DOB: [birth year]   42 y.o. Female  MRN: [id number] Visit Date: 10/04/2023  Today's healthcare provider: Ronnald Ramp, MD   Chief Complaint  Patient presents with   New Patient (Initial Visit)    Overall health   Subjective    Pamela Hamilton is a 42 y.o. female who presents today as a new patient to establish care.   HPI     New Patient (Initial Visit)    Additional comments: Overall health      Last edited by Acey Lav, CMA on 10/04/2023  3:34 PM.       Discussed the use of AI scribe software for clinical note transcription with the patient, who gave verbal consent to proceed.  History of Present Illness   The patient, with a family history of hyperlipidemia and cardiovascular disease, presents with concerns about elevated cholesterol levels. She reports that her LDL was 181 and triglycerides were 194 in a recent test, both higher than the desired levels. The patient has been attempting to manage these levels through dietary changes, including a low-fat diet and increased intake of foods rich in niacin. However, she expresses frustration with a lack of progress in weight loss despite these efforts and regular walking exercise.  She recalls a previous diagnosis of high TSH and a brief period on levothyroxine, which was discontinued due to lack of improvement in lethargy. The patient also mentions occasional chest pains, which she believes may be related to anxiety.  In addition to these concerns, the patient has a history of recurrent bronchitis, which she manages with inhalers. She also reports digestive issues, including bloating, gas, and irregular bowel movements, which she suspects may be related to IBS or dietary factors. The patient has a history of breast cysts, which are monitored regularly, and arthritic knees, which limit her ability to engage in more strenuous physical activity.  The patient's  family history is significant for cardiovascular disease, with multiple relatives experiencing heart attacks and her father having atrial fibrillation. She also reports a family history of sleep apnea and diverticulosis. The patient expresses a desire to avoid medication when possible, but acknowledges that it may be necessary given her genetic predispositions.      The 10-year ASCVD risk score (Arnett DK, et al., 2019) is: 1.3%    Past Medical History:  Diagnosis Date   Abortion, missed    Allergy    Anemia    Anxiety    Arthritis    Asthma    EXERCISE INDUCED   BRCA negative 07/2022   MyRisk neg except MSH3 VUS   Family history of breast cancer    genetic testing letter sent 3/19, 7/20; 8/23 IBIS=15.5%/riskscore=9.1%   GERD (gastroesophageal reflux disease)    OCC NO MEDS   Headache    MIGRAINES   History of methicillin resistant staphylococcus aureus (MRSA) 02/2018   NASAL SWAB    History of strep sore throat    Hyperlipidemia    Hypothyroidism     Outpatient Medications Prior to Visit  Medication Sig   acetaminophen (TYLENOL) 500 MG tablet Take 1,000 mg by mouth 2 (two) times daily as needed for moderate pain or headache.   albuterol (VENTOLIN HFA) 108 (90 Base) MCG/ACT inhaler Inhale 2 puffs into the lungs every 6 (six) hours as needed for wheezing or shortness of breath.    FLOVENT HFA 110 MCG/ACT inhaler Inhale 2 puffs into the lungs 2 (  two) times daily.   fluticasone (FLONASE) 50 MCG/ACT nasal spray Place 2 sprays into both nostrils daily.   Hypromellose (ARTIFICIAL TEARS OP) Place 1 drop into both eyes daily as needed (dry eyes).   Menthol, Topical Analgesic, (ICY HOT EX) Apply 1 application topically daily as needed (pain).   Multiple Vitamin (MULTI-VITAMINS) TABS Take 1 tablet by mouth every evening.    Multiple Vitamins-Minerals (ZINC PO) Take by mouth.   Omega-3 Fatty Acids (FISH OIL ULTRA) 1400 MG CAPS Take 1,400 mg by mouth every evening.   sodium chloride  (OCEAN) 0.65 % SOLN nasal spray Place 1 spray into both nostrils as needed for congestion.   vitamin C (ASCORBIC ACID) 500 MG tablet Take 500 mg by mouth every evening.   levonorgestrel (MIRENA) 20 MCG/DAY IUD 1 each by Intrauterine route once for 1 dose.   No facility-administered medications prior to visit.    Past Surgical History:  Procedure Laterality Date   BREAST CYST ASPIRATION Right 2008   BREAST CYST ASPIRATION Right 2009   DILATION AND CURETTAGE OF UTERUS     EYE SURGERY     X 3   SHOULDER ARTHROSCOPY Right    TONSILLECTOMY N/A 07/13/2018   Procedure: TONSILLECTOMY;  Surgeon: Bud Face, MD;  Location: [facility] ORS;  Service: ENT;  Laterality: N/A;   TONSILLECTOMY     WISDOM TOOTH EXTRACTION     Family Status  Relation Name Status   Mother  Alive   Father  Alive   Mat Aunt  Alive   MGM  Deceased   MGF  Deceased   PGM  Deceased   PGF  Deceased   Cousin pat Deceased   Other uncle (Not Specified)   Other pat grt aunt Deceased   Other pat grt aunt Deceased  No partnership data on file   Family History  Problem Relation Age of Onset   Hypertension Mother    Hyperlipidemia Mother    Diabetes Mellitus II Mother    Kidney disease Mother    Hypothyroidism Mother    Sleep apnea Mother    Heart disease Father    CAD Father    Hyperlipidemia Father    Sleep apnea Father    Breast cancer Maternal Aunt 43   Diabetes Maternal Grandmother    Hypertension Maternal Grandmother    Heart disease Paternal Grandmother    Heart disease Paternal Grandfather    Breast cancer Cousin 35   Heart disease Other    Breast cancer Other    Breast cancer Other    Social History   Socioeconomic History   Marital status: Married    Spouse name: Not on file   Number of children: Not on file   Years of education: Not on file   Highest education level: Not on file  Occupational History   Not on file  Tobacco Use   Smoking status: Never   Smokeless tobacco: Never  Vaping Use    Vaping status: Never Used  Substance and Sexual Activity   Alcohol use: Yes    Alcohol/week: 3.0 - 4.0 standard drinks of alcohol    Types: 3 - 4 Glasses of wine per week    Comment: OCC   Drug use: No   Sexual activity: Yes    Birth control/protection: I.U.D.    Comment: Mirena  Other Topics Concern   Not on file  Social History Narrative   Not on file   Social Determinants of Health   Financial Resource Strain:  Not on file  Food Insecurity: Not on file  Transportation Needs: Not on file  Physical Activity: Not on file  Stress: Not on file  Social Connections: Not on file     Allergies  Allergen Reactions   Versed [Midazolam]     PT STATES SHE BECOMES VERY DISORIENTED AND LOOPY    Immunization History  Administered Date(s) Administered   Hepatitis B, ADULT 05/12/2015, 06/13/2015, 11/19/2015   Influenza,inj,Quad PF,6+ Mos 08/24/2017   Influenza-Unspecified 07/20/2021, 09/12/2023   PFIZER SARS-COV-2 Pediatric Vaccination 5-70yrs 12/04/2020, 07/03/2021   PFIZER(Purple Top)SARS-COV-2 Vaccination 01/02/2021   PPD Test 04/17/2015, 05/26/2015, 04/19/2016, 07/12/2020   Tdap 07/12/2020    Health Maintenance  Topic Date Due   HIV Screening  Never done   Hepatitis C Screening  Never done   COVID-19 Vaccine (2 - 2023-24 season) 08/07/2023   Cervical Cancer Screening (HPV/Pap Cotest)  07/25/2028   DTaP/Tdap/Td (2 - Td or Tdap) 07/12/2030   INFLUENZA VACCINE  Completed   HPV VACCINES  Aged Out    Patient Care Team: Ronnald Ramp, MD as PCP - General (Family Medicine)  Review of Systems  Last CBC Lab Results  Component Value Date   WBC 8.2 07/26/2023   HGB 14.5 07/26/2023   HCT 42.8 07/26/2023   MCV 93 07/26/2023   MCH 31.7 07/26/2023   RDW 12.7 07/26/2023   PLT 273 07/26/2023   Last metabolic panel Lab Results  Component Value Date   GLUCOSE 96 07/26/2023   NA 139 07/26/2023   K 4.0 07/26/2023   CL 100 07/26/2023   CO2 24 07/26/2023    BUN 8 07/26/2023   CREATININE 1.00 07/26/2023   EGFR 72 07/26/2023   CALCIUM 9.4 07/26/2023   PROT 6.4 07/26/2023   ALBUMIN 4.3 07/26/2023   LABGLOB 2.1 07/26/2023   BILITOT 0.8 07/26/2023   ALKPHOS 73 07/26/2023   AST 19 07/26/2023   ALT 20 07/26/2023   Last lipids Lab Results  Component Value Date   CHOL 259 (H) 07/26/2023   HDL 42 07/26/2023   LDLCALC 181 (H) 07/26/2023   TRIG 194 (H) 07/26/2023   CHOLHDL 6.2 (H) 07/26/2023   Last hemoglobin A1c Lab Results  Component Value Date   HGBA1C 5.5 07/26/2023   Last thyroid functions Lab Results  Component Value Date   TSH 6.540 (H) 07/26/2023        Objective    BP 110/66 (BP Location: Left Arm, Patient Position: Sitting, Cuff Size: Normal)   Pulse 69   Ht 5\' 3"  (1.6 m)   Wt 201 lb (91.2 kg)   SpO2 97%   BMI 35.61 kg/m  BP Readings from Last 3 Encounters:  10/04/23 110/66  07/26/23 120/72  07/02/22 100/70   Wt Readings from Last 3 Encounters:  10/04/23 201 lb (91.2 kg)  07/26/23 202 lb (91.6 kg)  07/02/22 203 lb (92.1 kg)        Depression Screen    10/04/2023    3:40 PM  PHQ 2/9 Scores  PHQ - 2 Score 0   No results found for any visits on 10/04/23.   Physical Exam Vitals reviewed.  Constitutional:      General: She is not in acute distress.    Appearance: Normal appearance. She is not ill-appearing, toxic-appearing or diaphoretic.  Eyes:     Conjunctiva/sclera: Conjunctivae normal.  Cardiovascular:     Rate and Rhythm: Normal rate and regular rhythm.     Pulses: Normal pulses.     Heart  sounds: Normal heart sounds. No murmur heard.    No friction rub. No gallop.  Pulmonary:     Effort: Pulmonary effort is normal. No respiratory distress.     Breath sounds: Normal breath sounds. No stridor. No wheezing, rhonchi or rales.  Abdominal:     General: Bowel sounds are normal. There is no distension.     Palpations: Abdomen is soft.     Tenderness: There is no abdominal tenderness.   Musculoskeletal:     Right lower leg: No edema.     Left lower leg: No edema.  Skin:    Findings: No erythema or rash.  Neurological:     Mental Status: She is alert and oriented to person, place, and time.        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 Family history of heart disease. Patient is making lifestyle modifications including diet and exercise. Discussed the need for statin therapy given the family history and current lipid levels. The 10-year ASCVD risk score (Arnett DK, et al., 2019) is: 1.3%  -Consider starting statin therapy. Discuss options at next visit.discussed potential SE of cholesterol lowering medication (statins) also discussed potentially using zetia for cholesterol  -Repeat lipid panel in February 2025.      Inattention   Generalized abdominal pain    Patient reports bloating, gas, and slow motility. Family history of diverticulosis. Chronic discomfort  -submit referral to gastroenterology for further evaluation. 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 Mariah Milling and coronary CT       Relevant Orders   Ambulatory referral to Cardiology   Excessive sleepiness    Patient reports symptoms suggestive of sleep apnea including daytime fatigue and headaches. Family history of sleep apnea. -Refer to pulmonology for sleep study and possible pulmonary function tests.      Relevant Orders   Ambulatory referral to Pulmonology   Abnormal thyroid blood test    TSH 6.54, thyroxine 8.87. Patient has a history of being on levothyroxine but did not find it helpful. Currently experiencing symptoms of lethargy and focus issues. -Consider reinitiating levothyroxine therapy. If labs return abnormal during follow up visit -Repeat thyroid panel in February 2025.      Other Visit Diagnoses     Establishing care with new doctor, encounter for               Family Hx Diabetes  A1c within normal limits at 5.5. Family history of type 2 diabetes. Patient is making  lifestyle modifications including diet and exercise. -Continue lifestyle modifications. -Repeat A1c in February 2025.     Return in about 4 months (around 01/27/2024) for CPE.      Ronnald Ramp, MD  [facility] [phone] (phone) [phone] (fax)  [facility]

## 2023-10-04 NOTE — Assessment & Plan Note (Addendum)
Hyperlipidemia,chronic LDL 181, triglycerides 194, total cholesterol 259. Family history of heart disease. Patient is making lifestyle modifications including diet and exercise. Discussed the need for statin therapy given the family history and current lipid levels. The 10-year ASCVD risk score (Arnett DK, et al., 2019) is: 1.3%  -Consider starting statin therapy. Discuss options at next visit.discussed potential SE of cholesterol lowering medication (statins) also discussed potentially using zetia for cholesterol  -Repeat lipid panel in February 2025.

## 2023-10-04 NOTE — Assessment & Plan Note (Signed)
TSH 6.54, thyroxine 8.87. Patient has a history of being on levothyroxine but did not find it helpful. Currently experiencing symptoms of lethargy and focus issues. -Consider reinitiating levothyroxine therapy. If labs return abnormal during follow up visit -Repeat thyroid panel in February 2025.

## 2023-10-04 NOTE — Assessment & Plan Note (Signed)
Reports chronic annual bronchitis  Reports needing treatment for this annually for several weeks after URI  Referral submitted to pulmonology for sleep apnea evaluation and recommended discussing bronchitis diagnoses as well

## 2023-10-04 NOTE — Assessment & Plan Note (Signed)
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 Mariah Milling and coronary CT

## 2023-10-04 NOTE — Assessment & Plan Note (Signed)
Patient reports bloating, gas, and slow motility. Family history of diverticulosis. Chronic discomfort  -submit referral to gastroenterology for further evaluation. Denies hx of trial of bentyl for IBS symptoms

## 2023-10-04 NOTE — Patient Instructions (Signed)
VISIT SUMMARY:  During today's visit, we discussed your concerns about elevated cholesterol levels, prediabetes, hypothyroidism, gastrointestinal issues, and potential sleep apnea. We reviewed your family history and current symptoms, and we talked about possible next steps for managing these conditions.  YOUR PLAN:  -HYPERLIPIDEMIA: Hyperlipidemia means having high levels of fats (lipids) in your blood, which can increase the risk of heart disease. Your LDL cholesterol is 181 and triglycerides are 194, both higher than desired. We discussed the possibility of starting statin therapy to help lower these levels, and we will review this option at your next visit. Continue with your current diet and exercise routine. We will repeat your lipid panel in February 2025.  -PREDIABETES: Prediabetes means your blood sugar levels are higher than normal but not yet high enough to be diagnosed as diabetes. Your A1c is 5.5. Continue with your lifestyle modifications, including diet and exercise. We will repeat your A1c test in February 2025.  -HYPOTHYROIDISM: Hypothyroidism means your thyroid gland is not producing enough thyroid hormone, which can cause symptoms like lethargy and focus issues. Your TSH is 6.54. We discussed the possibility of restarting levothyroxine therapy and will review this option at your next visit. We will repeat your thyroid panel in February 2025.  -GASTROINTESTINAL ISSUES: You are experiencing bloating, gas, and slow digestion, which may be related to your diet or a condition like IBS. Given your family history of diverticulosis, we are considering a referral to a gastroenterologist for further evaluation.  -SLEEP APNEA: Sleep apnea is a condition where your breathing stops and starts during sleep, leading to daytime fatigue and headaches. Given your symptoms and family history, we are referring you to a pulmonologist for a sleep study and possible pulmonary function  tests.  INSTRUCTIONS:  Please follow up in February 2025 for repeat labs and to discuss medication options.

## 2023-10-04 NOTE — Assessment & Plan Note (Signed)
Patient reports symptoms suggestive of sleep apnea including daytime fatigue and headaches. Family history of sleep apnea. -Refer to pulmonology for sleep study and possible pulmonary function tests.

## 2023-10-14 ENCOUNTER — Encounter: Payer: Self-pay | Admitting: Internal Medicine

## 2023-11-08 ENCOUNTER — Other Ambulatory Visit: Payer: Self-pay

## 2023-11-08 DIAGNOSIS — J45909 Unspecified asthma, uncomplicated: Secondary | ICD-10-CM | POA: Diagnosis not present

## 2023-11-08 DIAGNOSIS — K5732 Diverticulitis of large intestine without perforation or abscess without bleeding: Secondary | ICD-10-CM | POA: Insufficient documentation

## 2023-11-08 DIAGNOSIS — D72829 Elevated white blood cell count, unspecified: Secondary | ICD-10-CM | POA: Diagnosis not present

## 2023-11-08 DIAGNOSIS — E039 Hypothyroidism, unspecified: Secondary | ICD-10-CM | POA: Diagnosis not present

## 2023-11-08 DIAGNOSIS — Z7951 Long term (current) use of inhaled steroids: Secondary | ICD-10-CM | POA: Diagnosis not present

## 2023-11-08 DIAGNOSIS — K5792 Diverticulitis of intestine, part unspecified, without perforation or abscess without bleeding: Secondary | ICD-10-CM | POA: Diagnosis not present

## 2023-11-08 DIAGNOSIS — R1032 Left lower quadrant pain: Secondary | ICD-10-CM | POA: Diagnosis not present

## 2023-11-08 MED ORDER — ONDANSETRON 4 MG PO TBDP
4.0000 mg | ORAL_TABLET | Freq: Once | ORAL | Status: AC
  Administered 2023-11-08: 4 mg via ORAL
  Filled 2023-11-08: qty 1

## 2023-11-08 NOTE — ED Triage Notes (Signed)
Pt presents to ER with c/o LLQ abd pain that started this afternoon.  Pt reports she has also had some chills, but has not had any fevers.  Does report having some diarrhea and nausea.  Reports pain does not radiate to flank area.  No dysuria, or vaginal bleeding.  Has had ovarian cysts in the past.  Pt is otherwise A&O x4 and in NAD at this time.

## 2023-11-09 ENCOUNTER — Emergency Department: Payer: 59

## 2023-11-09 ENCOUNTER — Other Ambulatory Visit: Payer: Self-pay

## 2023-11-09 ENCOUNTER — Emergency Department
Admission: EM | Admit: 2023-11-09 | Discharge: 2023-11-09 | Disposition: A | Discharge status: Home / Self Care | Payer: 59 | Attending: Emergency Medicine | Admitting: Emergency Medicine

## 2023-11-09 DIAGNOSIS — R1032 Left lower quadrant pain: Secondary | ICD-10-CM

## 2023-11-09 DIAGNOSIS — K5732 Diverticulitis of large intestine without perforation or abscess without bleeding: Secondary | ICD-10-CM | POA: Diagnosis not present

## 2023-11-09 DIAGNOSIS — K5792 Diverticulitis of intestine, part unspecified, without perforation or abscess without bleeding: Secondary | ICD-10-CM

## 2023-11-09 LAB — COMPREHENSIVE METABOLIC PANEL
ALT: 23 U/L (ref 0–44)
AST: 20 U/L (ref 15–41)
Albumin: 4.6 g/dL (ref 3.5–5.0)
Alkaline Phosphatase: 72 U/L (ref 38–126)
Anion gap: 11 (ref 5–15)
BUN: 11 mg/dL (ref 6–20)
CO2: 23 mmol/L (ref 22–32)
Calcium: 9 mg/dL (ref 8.9–10.3)
Chloride: 101 mmol/L (ref 98–111)
Creatinine, Ser: 0.91 mg/dL (ref 0.44–1.00)
GFR, Estimated: >60 mL/min (ref >60)
Glucose, Bld: 114 mg/dL — ABNORMAL HIGH (ref 70–99)
Potassium: 3.6 mmol/L (ref 3.5–5.1)
Sodium: 135 mmol/L (ref 135–145)
Total Bilirubin: 1.4 mg/dL — ABNORMAL HIGH (ref <1.2)
Total Protein: 8.1 g/dL (ref 6.5–8.1)

## 2023-11-09 LAB — CBC
HCT: 45.4 % (ref 36.0–46.0)
Hemoglobin: 15.5 g/dL — ABNORMAL HIGH (ref 12.0–15.0)
MCH: 31.6 pg (ref 26.0–34.0)
MCHC: 34.1 g/dL (ref 30.0–36.0)
MCV: 92.5 fL (ref 80.0–100.0)
Platelets: 290 10*3/uL (ref 150–400)
RBC: 4.91 MIL/uL (ref 3.87–5.11)
RDW: 13 % (ref 11.5–15.5)
WBC: 15.8 10*3/uL — ABNORMAL HIGH (ref 4.0–10.5)
nRBC: 0 % (ref 0.0–0.2)

## 2023-11-09 LAB — URINALYSIS, ROUTINE W REFLEX MICROSCOPIC
Bilirubin Urine: NEGATIVE
Glucose, UA: NEGATIVE mg/dL
Hgb urine dipstick: NEGATIVE
Ketones, ur: NEGATIVE mg/dL
Leukocytes,Ua: NEGATIVE
Nitrite: NEGATIVE
Protein, ur: NEGATIVE mg/dL
Specific Gravity, Urine: 1.018 (ref 1.005–1.030)
pH: 5 (ref 5.0–8.0)

## 2023-11-09 LAB — POC URINE PREG, ED: Preg Test, Ur: NEGATIVE

## 2023-11-09 LAB — LIPASE, BLOOD: Lipase: 27 U/L (ref 11–51)

## 2023-11-09 MED ORDER — ONDANSETRON 4 MG PO TBDP
4.0000 mg | ORAL_TABLET | Freq: Four times a day (QID) | ORAL | 0 refills | Status: DC | PRN
  Filled 2023-11-09: qty 20, 5d supply, fill #0

## 2023-11-09 MED ORDER — PIPERACILLIN-TAZOBACTAM 3.375 G IVPB 30 MIN
3.3750 g | Freq: Once | INTRAVENOUS | Status: AC
  Administered 2023-11-09: 3.375 g via INTRAVENOUS
  Filled 2023-11-09 (×2): qty 50

## 2023-11-09 MED ORDER — ACETAMINOPHEN 500 MG PO TABS
1000.0000 mg | ORAL_TABLET | Freq: Once | ORAL | Status: AC
  Administered 2023-11-09: 1000 mg via ORAL
  Filled 2023-11-09: qty 2

## 2023-11-09 MED ORDER — AMOXICILLIN-POT CLAVULANATE 875-125 MG PO TABS
1.0000 | ORAL_TABLET | Freq: Two times a day (BID) | ORAL | 0 refills | Status: DC
  Filled 2023-11-09: qty 14, 7d supply, fill #0

## 2023-11-09 MED ORDER — IOHEXOL 300 MG/ML  SOLN
100.0000 mL | Freq: Once | INTRAMUSCULAR | Status: AC | PRN
Start: 2023-11-09 — End: 2023-11-09
  Administered 2023-11-09: 100 mL via INTRAVENOUS

## 2023-11-09 MED ORDER — MORPHINE SULFATE (PF) 4 MG/ML IV SOLN
4.0000 mg | Freq: Once | INTRAVENOUS | Status: AC
  Administered 2023-11-09: 4 mg via INTRAVENOUS
  Filled 2023-11-09: qty 1

## 2023-11-09 MED ORDER — OXYCODONE-ACETAMINOPHEN 5-325 MG PO TABS
2.0000 | ORAL_TABLET | Freq: Three times a day (TID) | ORAL | 0 refills | Status: DC | PRN
  Filled 2023-11-09: qty 16, 3d supply, fill #0

## 2023-11-09 MED ORDER — SODIUM CHLORIDE 0.9 % IV BOLUS (SEPSIS)
1000.0000 mL | Freq: Once | INTRAVENOUS | Status: AC
  Administered 2023-11-09: 1000 mL via INTRAVENOUS

## 2023-11-09 MED ORDER — ONDANSETRON HCL 4 MG/2ML IJ SOLN
4.0000 mg | Freq: Once | INTRAMUSCULAR | Status: AC
  Administered 2023-11-09: 4 mg via INTRAVENOUS
  Filled 2023-11-09: qty 2

## 2023-11-09 NOTE — ED Notes (Signed)
Md at bedside

## 2023-11-09 NOTE — ED Provider Notes (Signed)
[facility] Provider Note    Event Date/Time   First MD Initiated Contact with Patient 11/09/23 [phone]     (approximate)   History   Abdominal Pain   HPI  Pamela Hamilton is a 42 y.o. female with history of ovarian cyst, hyperlipidemia, hypothyroidism who presents to the emergency department with left lower quadrant abdominal pain that is sharp and severe in nature that started yesterday afternoon.  Has had chills but no fever.  Reports nausea and diarrhea.  No dysuria, hematuria, vaginal bleeding or discharge.  States when she has had ovarian cysts in the past they were just seen on ultrasound but were not causing pain.  She is never experienced discomfort like this before.   History provided by patient.    Past Medical History:  Diagnosis Date   Abortion, missed    Allergy    Anemia    Anxiety    Arthritis    Asthma    EXERCISE INDUCED   BRCA negative 07/2022   MyRisk neg except MSH3 VUS   Family history of breast cancer    genetic testing letter sent 3/19, 7/20; 8/23 IBIS=15.5%/riskscore=9.1%   GERD (gastroesophageal reflux disease)    OCC NO MEDS   Headache    MIGRAINES   History of methicillin resistant staphylococcus aureus (MRSA) 02/2018   NASAL SWAB    History of strep sore throat    Hyperlipidemia    Hypothyroidism     Past Surgical History:  Procedure Laterality Date   BREAST CYST ASPIRATION Right 2008   BREAST CYST ASPIRATION Right 2009   DILATION AND CURETTAGE OF UTERUS     EYE SURGERY     X 3   SHOULDER ARTHROSCOPY Right    TONSILLECTOMY N/A 07/13/2018   Procedure: TONSILLECTOMY;  Surgeon: Bud Face, MD;  Location: [facility] ORS;  Service: ENT;  Laterality: N/A;   TONSILLECTOMY     WISDOM TOOTH EXTRACTION      MEDICATIONS:  Prior to Admission medications   Medication Sig Start Date End Date Taking? Authorizing Provider  acetaminophen (TYLENOL) 500 MG tablet Take 1,000 mg by mouth 2 (two) times daily as needed for  moderate pain or headache.    [provider]  albuterol (VENTOLIN HFA) 108 (90 Base) MCG/ACT inhaler Inhale 2 puffs into the lungs every 6 (six) hours as needed for wheezing or shortness of breath.  10/18/17   [provider]  FLOVENT HFA 110 MCG/ACT inhaler Inhale 2 puffs into the lungs 2 (two) times daily. 06/14/22   [provider]  fluticasone (FLONASE) 50 MCG/ACT nasal spray Place 2 sprays into both nostrils daily. 10/30/19   Hedges, Tinnie Gens, PA-C  Hypromellose (ARTIFICIAL TEARS OP) Place 1 drop into both eyes daily as needed (dry eyes).    [provider]  levonorgestrel (MIRENA) 20 MCG/DAY IUD 1 each by Intrauterine route once for 1 dose. 04/01/22 04/01/22  Copland, Ilona Sorrel, PA-C  Menthol, Topical Analgesic, (ICY HOT EX) Apply 1 application topically daily as needed (pain).    [provider]  Multiple Vitamin (MULTI-VITAMINS) TABS Take 1 tablet by mouth every evening.     [provider]  Multiple Vitamins-Minerals (ZINC PO) Take by mouth.    [provider]  Omega-3 Fatty Acids (FISH OIL ULTRA) 1400 MG CAPS Take 1,400 mg by mouth every evening.    [provider]  sodium chloride (OCEAN) 0.65 % SOLN nasal spray Place 1 spray into both nostrils as needed for  congestion.    [provider]  vitamin C (ASCORBIC ACID) 500 MG tablet Take 500 mg by mouth every evening.    [provider]  loratadine (CLARITIN) 10 MG tablet Take 10 mg by mouth daily as needed for allergies.  12/09/20  [provider]    Physical Exam   Triage Vital Signs: ED Triage Vitals  Encounter Vitals Group     BP 11/08/23 2349 (!) 146/89     Systolic BP Percentile --      Diastolic BP Percentile --      Pulse Rate 11/08/23 2349 (!) 113     Resp 11/08/23 2349 20     Temp 11/08/23 2349 98.3 F (36.8 C)     Temp Source 11/08/23 2349 Oral     SpO2 11/08/23 2349 100 %     Weight 11/08/23 2353 200 lb (90.7 kg)     Height  11/08/23 2353 5\' 3"  (1.6 m)     Head Circumference --      Peak Flow --      Pain Score 11/08/23 2352 8     Pain Loc --      Pain Education --      Exclude from Growth Chart --     Most recent vital signs: Vitals:   11/09/23 0700 11/09/23 0715  BP: (!) 99/58 102/62  Pulse: 77 81  Resp:    Temp:    SpO2:      CONSTITUTIONAL: Alert, responds appropriately to questions.  Appears uncomfortable HEAD: Normocephalic, atraumatic EYES: Conjunctivae clear, pupils appear equal, sclera nonicteric ENT: normal nose; moist mucous membranes NECK: Supple, normal ROM CARD: Regular and tachycardic; S1 and S2 appreciated RESP: Normal chest excursion without splinting or tachypnea; breath sounds clear and equal bilaterally; no wheezes, no rhonchi, no rales, no hypoxia or respiratory distress, speaking full sentences ABD/GI: Non-distended; soft, tender to palpation in the left pelvic and left lower quadrant with intermittent guarding, no rebound, no tenderness at McBurney's point BACK: The back appears normal EXT: Normal ROM in all joints; no deformity noted, no edema SKIN: Normal color for age and race; warm; no rash on exposed skin NEURO: Moves all extremities equally, normal speech PSYCH: The patient's mood and manner are appropriate.   ED Results / Procedures / Treatments   LABS: (all labs ordered are listed, but only abnormal results are displayed) Labs Reviewed  CBC - Abnormal; Notable for the following components:      Result Value   WBC 15.8 (*)    Hemoglobin 15.5 (*)    All other components within normal limits  COMPREHENSIVE METABOLIC PANEL - Abnormal; Notable for the following components:   Glucose, Bld 114 (*)    Total Bilirubin 1.4 (*)    All other components within normal limits  URINALYSIS, ROUTINE W REFLEX MICROSCOPIC - Abnormal; Notable for the following components:   Color, Urine YELLOW (*)    APPearance HAZY (*)    All other components within normal limits  LIPASE,  BLOOD  POC URINE PREG, ED     EKG:  EKG Interpretation Date/Time:    Ventricular Rate:    PR Interval:    QRS Duration:    QT Interval:    QTC Calculation:   R Axis:      Text Interpretation:           RADIOLOGY: My personal review and interpretation of imaging: CT scan shows sigmoid diverticulitis.  No perforation or abscess.  No ovarian torsion  on ultrasound.  I have personally reviewed all radiology reports.   US PELVIC COMPLETE W TRANSVAGINAL AND TORSION R/O  Result Date: 11/09/2023 CLINICAL DATA:  42 year old female with left lower quadrant pain. Sigmoid diverticulitis on CT. IUD. EXAM: TRANSABDOMINAL AND TRANSVAGINAL ULTRASOUND OF PELVIS DOPPLER ULTRASOUND OF OVARIES TECHNIQUE: Both transabdominal and transvaginal ultrasound examinations of the pelvis were performed. Transabdominal technique was performed for global imaging of the pelvis including uterus, ovaries, adnexal regions, and pelvic cul-de-sac. It was necessary to proceed with endovaginal exam following the transabdominal exam to visualize the ovaries. Color and duplex Doppler ultrasound was utilized to evaluate blood flow to the ovaries. COMPARISON:  CT Abdomen and Pelvis 0456 hours today. FINDINGS: Uterus Measurements: 8.1 x 4.5 x 5.1 cm = volume: 97 mL. No fibroids or other mass visualized. Endometrium Thickness: Up to 6 mm. IUD with positioning that appears appropriate (image 58). Right ovary Measurements: 3.2 x 1.4 x 2.1 cm = volume: 5 mL. Normal appearance/no adnexal mass. Left ovary Measurements: 3.1 x 1.7 x 2.3 cm = volume: 7 mL. Within normal limits for reproductive age patient. Pulsed Doppler evaluation of both ovaries demonstrates normal low-resistance arterial and venous waveforms. Other findings Trace simple appearing free fluid in the cul-de-sac (image 120). IMPRESSION: Negative for ovarian torsion. Physiologic ultrasound appearance of the pelvis, IUD appears appropriately positioned. Electronically Signed    By: Odessa Fleming M.D.   On: 11/09/2023 06:37   CT ABDOMEN PELVIS W CONTRAST  Result Date: 11/09/2023 CLINICAL DATA:  Left lower quadrant abdominal pain. EXAM: CT ABDOMEN AND PELVIS WITH CONTRAST TECHNIQUE: Multidetector CT imaging of the abdomen and pelvis was performed using the standard protocol following bolus administration of intravenous contrast. RADIATION DOSE REDUCTION: This exam was performed according to the departmental dose-optimization program which includes automated exposure control, adjustment of the mA and/or kV according to patient size and/or use of iterative reconstruction technique. CONTRAST:  OMNIPAQUE IOHEXOL 300 MG/ML  SOLN COMPARISON:  None Available. FINDINGS: Lower chest: No acute findings Hepatobiliary: No suspicious focal abnormality within the liver parenchyma. There is no evidence for gallstones, gallbladder wall thickening, or pericholecystic fluid. No intrahepatic or extrahepatic biliary dilation. Pancreas: No focal mass lesion. No dilatation of the main duct. No intraparenchymal cyst. No peripancreatic edema. Spleen: No splenomegaly. No suspicious focal mass lesion. Adrenals/Urinary Tract: No adrenal nodule or mass. Kidneys unremarkable. No evidence for hydroureter. The urinary bladder appears normal for the degree of distention. Stomach/Bowel: Stomach is unremarkable. No gastric wall thickening. No evidence of outlet obstruction. Duodenum is normally positioned as is the ligament of Treitz. No small bowel wall thickening. No small bowel dilatation. The terminal ileum is normal. The appendix is normal. Scattered diverticula are seen in the colon with more advanced diverticulosis in the sigmoid segment. There is associated edematous wall thickening in the sigmoid colon with pericolonic edema/inflammation compatible with acute diverticulitis. No definite perforation at this time. No para colonic abscess. Vascular/Lymphatic: There is mild atherosclerotic calcification of the  abdominal aorta without aneurysm. There is no gastrohepatic or hepatoduodenal ligament lymphadenopathy. No retroperitoneal or mesenteric lymphadenopathy. No pelvic sidewall lymphadenopathy. Reproductive: IUD visualized in the uterus. There is no adnexal mass. Other: No intraperitoneal free fluid. Musculoskeletal: No worrisome lytic or sclerotic osseous abnormality. IMPRESSION: 1. Sigmoid diverticulitis. No definite perforation at this time. No para colonic abscess. 2.  Aortic Atherosclerosis (ICD10-I70.0). Electronically Signed   By: Kennith Center M.D.   On: 11/09/2023 05:13     PROCEDURES:  Critical Care performed: No  Procedures    IMPRESSION / MDM / ASSESSMENT AND PLAN / ED COURSE  I reviewed the triage vital signs and the nursing notes.    Patient here with left lower quadrant pain.  Low-grade temp of 99.9.  Patient is tachycardic.     DIFFERENTIAL DIAGNOSIS (includes but not limited to):   Colitis, diverticulitis, UTI, pyelonephritis, kidney stone, TOA, torsion, ectopic, ovarian cyst   Patient's presentation is most consistent with acute presentation with potential threat to life or bodily function.   PLAN: Labs obtained from triage show leukocytosis of 15,000.  Normal LFTs, lipase.  Pregnancy test negative.  Urine pending.  Will obtain transvaginal ultrasound with Doppler given pelvic discomfort on exam but also a CT of the abdomen pelvis given she has pain more superiorly in the left mid to lower abdomen as well.  Will give IV fluids, pain and nausea medicine.   MEDICATIONS GIVEN IN ED: Medications  ondansetron (ZOFRAN-ODT) disintegrating tablet 4 mg (4 mg Oral Given 11/08/23 2357)  sodium chloride 0.9 % bolus 1,000 mL (0 mLs Intravenous Stopped 11/09/23 0556)  acetaminophen (TYLENOL) tablet 1,000 mg (1,000 mg Oral Given 11/09/23 0420)  morphine (PF) 4 MG/ML injection 4 mg (4 mg Intravenous Given 11/09/23 0420)  ondansetron (ZOFRAN) injection 4 mg (4 mg Intravenous Given  11/09/23 0420)  iohexol (OMNIPAQUE) 300 MG/ML solution 100 mL (100 mLs Intravenous Contrast Given 11/09/23 0448)  piperacillin-tazobactam (ZOSYN) IVPB 3.375 g (0 g Intravenous Stopped 11/09/23 0705)     ED COURSE: CT scan reviewed and interpreted by myself and the radiologist and shows sigmoid diverticulitis without abscess or perforation.  Ultrasound shows no torsion or cyst.  Patient reports feeling better.  I have offered admission for pain control and antibiotics but she states she would like to go home.  Given dose of Zosyn here.  Will discharge with pain medication and Augmentin.  Discussed return precautions and supportive care instructions.   At this time, I do not feel there is any life-threatening condition present. I reviewed all nursing notes, vitals, pertinent previous records.  All lab and urine results, EKGs, imaging ordered have been independently reviewed and interpreted by myself.  I reviewed all available radiology reports from any imaging ordered this visit.  Based on my assessment, I feel the patient is safe to be discharged home without further emergent workup and can continue workup as an outpatient as needed. Discussed all findings, treatment plan as well as usual and customary return precautions.  They verbalize understanding and are comfortable with this plan.  Outpatient follow-up has been provided as needed.  All questions have been answered.    CONSULTS:  none   OUTSIDE RECORDS REVIEWED: Reviewed last PCP note on 10/04/2023.       FINAL CLINICAL IMPRESSION(S) / ED DIAGNOSES   Final diagnoses:  LLQ abdominal pain  Diverticulitis     Rx / DC Orders   ED Discharge Orders          Ordered    oxyCODONE-acetaminophen (PERCOCET) 5-325 MG tablet  Every 8 hours PRN        11/09/23 0634    ondansetron (ZOFRAN-ODT) 4 MG disintegrating tablet  Every 6 hours PRN        11/09/23 0634    amoxicillin-clavulanate (AUGMENTIN) 875-125 MG tablet  2 times daily         11/09/23 3086             Note:  This document was prepared using Dragon voice recognition software  and may include unintentional dictation errors.   Millee Denise, Layla Maw, DO 11/09/23 [phone]

## 2023-11-09 NOTE — Discharge Instructions (Addendum)

## 2023-11-09 NOTE — ED Notes (Signed)
Report to heather, rn

## 2023-11-09 NOTE — ED Notes (Signed)
Pt to ct scan.

## 2023-11-09 NOTE — ED Notes (Signed)
Pt remains in radiology, will reassess pain, vital signs when pt returns.

## 2023-11-14 DIAGNOSIS — H18453 Nodular corneal degeneration, bilateral: Secondary | ICD-10-CM | POA: Diagnosis not present

## 2023-11-14 DIAGNOSIS — H524 Presbyopia: Secondary | ICD-10-CM | POA: Diagnosis not present

## 2023-11-14 DIAGNOSIS — H5203 Hypermetropia, bilateral: Secondary | ICD-10-CM | POA: Diagnosis not present

## 2023-11-14 DIAGNOSIS — H52223 Regular astigmatism, bilateral: Secondary | ICD-10-CM | POA: Diagnosis not present

## 2023-11-26 ENCOUNTER — Encounter: Payer: Self-pay | Admitting: *Deleted

## 2023-11-26 ENCOUNTER — Other Ambulatory Visit: Payer: Self-pay

## 2023-11-26 ENCOUNTER — Ambulatory Visit
Admission: EM | Admit: 2023-11-26 | Discharge: 2023-11-26 | Disposition: A | Discharge status: Home / Self Care | Payer: 59 | Attending: Emergency Medicine | Admitting: Emergency Medicine

## 2023-11-26 DIAGNOSIS — J4521 Mild intermittent asthma with (acute) exacerbation: Secondary | ICD-10-CM

## 2023-11-26 DIAGNOSIS — J069 Acute upper respiratory infection, unspecified: Secondary | ICD-10-CM

## 2023-11-26 MED ORDER — PREDNISONE 10 MG (21) PO TBPK: ORAL_TABLET | Freq: Every day | ORAL | 0 refills | Status: DC

## 2023-11-26 MED ORDER — METHYLPREDNISOLONE ACETATE 80 MG/ML IJ SUSP
80.0000 mg | Freq: Once | INTRAMUSCULAR | Status: AC
  Administered 2023-11-26: 80 mg via INTRAMUSCULAR

## 2023-11-26 NOTE — Discharge Instructions (Signed)
Your symptoms today are most likely being caused by a virus and should steadily improve in time it can take up to 7 to 10 days before you truly start to see a turnaround however things will get better  You have been given an injection of steroids to help open and relax the airway and ideally will calm your shortness of breath and chest tightness  Starting tomorrow take oral prednisone as directed to continue the above process  May continue over-the-counter medicines that you are currently using as well as attempt any of the following below    You can take Tylenol and/or Ibuprofen as needed for fever reduction and pain relief.   For cough: honey 1/2 to 1 teaspoon (you can dilute the honey in water or another fluid).  You can also use guaifenesin and dextromethorphan for cough. You can use a humidifier for chest congestion and cough.  If you don't have a humidifier, you can sit in the bathroom with the hot shower running.      For sore throat: try warm salt water gargles, cepacol lozenges, throat spray, warm tea or water with lemon/honey, popsicles or ice, or OTC cold relief medicine for throat discomfort.   For congestion: take a daily anti-histamine like Zyrtec, Claritin, and a oral decongestant, such as pseudoephedrine.  You can also use Flonase 1-2 sprays in each nostril daily.   It is important to stay hydrated: drink plenty of fluids (water, gatorade/powerade/pedialyte, juices, or teas) to keep your throat moisturized and help further relieve irritation/discomfort.

## 2023-11-26 NOTE — ED Provider Notes (Signed)
Renaldo Fiddler    CSN: [id number] Arrival date & time: 11/26/23  1245      History   Chief Complaint Chief Complaint  Patient presents with   Sore Throat   Shortness of Breath    HPI Pamela Hamilton is a 42 y.o. female.   Patient presents for evaluation of nasal congestion, rhinorrhea and a sore throat present for 5 days, begin to experience shortness of breath and chest tightness 1 day ago.  Known sick contacts in household.  Has attempted use of Mucinex and albuterol inhaler.  History of asthma and bronchitis.  Denies wheezing, fever.  Past Medical History:  Diagnosis Date   Abortion, missed    Allergy    Anemia    Anxiety    Arthritis    Asthma    EXERCISE INDUCED   BRCA negative 07/2022   MyRisk neg except MSH3 VUS   Family history of breast cancer    genetic testing letter sent 3/19, 7/20; 8/23 IBIS=15.5%/riskscore=9.1%   GERD (gastroesophageal reflux disease)    OCC NO MEDS   Headache    MIGRAINES   History of methicillin resistant staphylococcus aureus (MRSA) 02/2018   NASAL SWAB    History of strep sore throat    Hyperlipidemia    Hypothyroidism     Patient Active Problem List   Diagnosis Date Noted   Abnormal thyroid blood test 10/04/2023   Generalized abdominal pain 10/04/2023   Inattention 10/04/2023   Excessive sleepiness 10/04/2023   Bronchitis, 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   Family history of breast cancer 07/02/2022   Family history of premature coronary artery disease 04/27/2018   Mixed hyperlipidemia 04/27/2018    Past Surgical History:  Procedure Laterality Date   BREAST CYST ASPIRATION Right 2008   BREAST CYST ASPIRATION Right 2009   DILATION AND CURETTAGE OF UTERUS     EYE SURGERY     X 3   SHOULDER ARTHROSCOPY Right    TONSILLECTOMY N/A 07/13/2018   Procedure: TONSILLECTOMY;  Surgeon: Bud Face, MD;  Location: [facility] ORS;  Service: ENT;  Laterality: N/A;   TONSILLECTOMY     WISDOM TOOTH EXTRACTION       OB History     Gravida  3   Para  2   Term  2   Preterm      AB  1   Living  2      SAB  1   IAB      Ectopic      Multiple      Live Births  2            Home Medications    Prior to Admission medications   Medication Sig Start Date End Date Taking? Authorizing Provider  albuterol (VENTOLIN HFA) 108 (90 Base) MCG/ACT inhaler Inhale 2 puffs into the lungs every 6 (six) hours as needed for wheezing or shortness of breath.  10/18/17  Yes [provider]  FLOVENT HFA 110 MCG/ACT inhaler Inhale 2 puffs into the lungs 2 (two) times daily. 06/14/22  Yes [provider]  Hypromellose (ARTIFICIAL TEARS OP) Place 1 drop into both eyes daily as needed (dry eyes).   Yes [provider]  Menthol, Topical Analgesic, (ICY HOT EX) Apply 1 application topically daily as needed (pain).   Yes [provider]  Multiple Vitamin (MULTI-VITAMINS) TABS Take 1 tablet by mouth every evening.    Yes [provider]  Multiple Vitamins-Minerals (ZINC PO) Take by mouth.  Yes [provider]  Omega-3 Fatty Acids (FISH OIL ULTRA) 1400 MG CAPS Take 1,400 mg by mouth every evening.   Yes [provider]  predniSONE (STERAPRED UNI-PAK 21 TAB) 10 MG (21) TBPK tablet Take by mouth daily. Take 6 tabs by mouth daily  for 1 days, then 5 tabs for 1 days, then 4 tabs for 1 days, then 3 tabs for 1 days, 2 tabs for 1 days, then 1 tab by mouth daily for 1 days 11/26/23  Yes Lanique Gonzalo R, NP  sodium chloride (OCEAN) 0.65 % SOLN nasal spray Place 1 spray into both nostrils as needed for congestion.   Yes [provider]  vitamin C (ASCORBIC ACID) 500 MG tablet Take 500 mg by mouth every evening.   Yes [provider]  acetaminophen (TYLENOL) 500 MG tablet Take 1,000 mg by mouth 2 (two) times daily as needed for moderate pain or headache.    [provider]  amoxicillin-clavulanate (AUGMENTIN) 875-125 MG tablet Take  1 tablet by mouth 2 (two) times daily. 11/09/23   Ward, Layla Maw, DO  fluticasone (FLONASE) 50 MCG/ACT nasal spray Place 2 sprays into both nostrils daily. 10/30/19   Hedges, Tinnie Gens, PA-C  levonorgestrel (MIRENA) 20 MCG/DAY IUD 1 each by Intrauterine route once for 1 dose. 04/01/22 04/01/22  Copland, Helmut Muster B, PA-C  ondansetron (ZOFRAN-ODT) 4 MG disintegrating tablet Take 1 tablet (4 mg total) by mouth every 6 (six) hours as needed for nausea or vomiting. 11/09/23   Ward, Layla Maw, DO  oxyCODONE-acetaminophen (PERCOCET) 5-325 MG tablet Take 2 tablets by mouth every 8 (eight) hours as needed. 11/09/23 11/08/24  Ward, Layla Maw, DO  loratadine (CLARITIN) 10 MG tablet Take 10 mg by mouth daily as needed for allergies.  12/09/20  [provider]    Family History Family History  Problem Relation Age of Onset   Hypertension Mother    Hyperlipidemia Mother    Diabetes Mellitus II Mother    Kidney disease Mother    Hypothyroidism Mother    Sleep apnea Mother    Heart disease Father    CAD Father    Hyperlipidemia Father    Sleep apnea Father    Breast cancer Maternal Aunt 64   Diabetes Maternal Grandmother    Hypertension Maternal Grandmother    Heart disease Paternal Grandmother    Heart disease Paternal Grandfather    Breast cancer Cousin 35   Heart disease Other    Breast cancer Other    Breast cancer Other     Social History Social History   Tobacco Use   Smoking status: Never   Smokeless tobacco: Never  Vaping Use   Vaping status: Never Used  Substance Use Topics   Alcohol use: Yes    Alcohol/week: 3.0 - 4.0 standard drinks of alcohol    Types: 3 - 4 Glasses of wine per week    Comment: OCC   Drug use: No     Allergies   Versed [midazolam]   Review of Systems Review of Systems  Respiratory:  Positive for shortness of breath.      Physical Exam Triage Vital Signs ED Triage Vitals  Encounter Vitals Group     BP 11/26/23 1359 127/87     Systolic BP  Percentile --      Diastolic BP Percentile --      Pulse Rate 11/26/23 1359 65     Resp 11/26/23 1359 20     Temp 11/26/23 1359 98.4  F (36.9 C)     Temp src --      SpO2 11/26/23 1359 97 %     Weight --      Height --      Head Circumference --      Peak Flow --      Pain Score 11/26/23 1354 2     Pain Loc --      Pain Education --      Exclude from Growth Chart --    No data found.  Updated Vital Signs BP 127/87   Pulse 65   Temp 98.4 F (36.9 C)   Resp 20   LMP 11/23/2023   SpO2 97%   Visual Acuity Right Eye Distance:   Left Eye Distance:   Bilateral Distance:    Right Eye Near:   Left Eye Near:    Bilateral Near:     Physical Exam Constitutional:      Appearance: Normal appearance.  HENT:     Head: Normocephalic.     Right Ear: Tympanic membrane, ear canal and external ear normal.     Left Ear: Tympanic membrane, ear canal and external ear normal.     Nose: Congestion present. No rhinorrhea.     Mouth/Throat:     Mouth: Mucous membranes are moist.     Pharynx: Oropharynx is clear.  Eyes:     Extraocular Movements: Extraocular movements intact.  Cardiovascular:     Rate and Rhythm: Normal rate and regular rhythm.     Pulses: Normal pulses.     Heart sounds: Normal heart sounds.  Pulmonary:     Effort: Pulmonary effort is normal.     Breath sounds: Normal breath sounds.     Comments: Wheezing with cough only Musculoskeletal:     Cervical back: Normal range of motion and neck supple.  Neurological:     Mental Status: She is alert and oriented to person, place, and time. Mental status is at baseline.      UC Treatments / Results  Labs (all labs ordered are listed, but only abnormal results are displayed) Labs Reviewed - No data to display  EKG   Radiology No results found.  Procedures Procedures (including critical care time)  Medications Ordered in UC Medications  methylPREDNISolone acetate (DEPO-MEDROL) injection 80 mg (80 mg  Intramuscular Given 11/26/23 1424)    Initial Impression / Assessment and Plan / UC Course  I have reviewed the triage vital signs and the nursing notes.  Pertinent labs & imaging results that were available during my care of the patient were reviewed by me and considered in my medical decision making (see chart for details).  Viral URI with cough, mild intermittent asthma with acute exacerbation  Patient is in no signs of distress nor toxic appearing.  Vital signs are stable.  Low suspicion for pneumonia, pneumothorax or bronchitis and therefore will defer imaging.  Viral symptoms most likely flared asthma.  Methylprednisolone IM given and prescribed prednisone taper for home use, declined cough prescription. May use additional over-the-counter medications as needed for supportive care.  May follow-up with urgent care as needed if symptoms persist or worsen.   Final Clinical Impressions(s) / UC Diagnoses   Final diagnoses:  Viral URI with cough  Mild intermittent asthma with acute exacerbation     Discharge Instructions      Your symptoms today are most likely being caused by a virus and should steadily improve in time it can take up to 7  to 10 days before you truly start to see a turnaround however things will get better  You have been given an injection of steroids to help open and relax the airway and ideally will calm your shortness of breath and chest tightness  Starting tomorrow take oral prednisone as directed to continue the above process  May continue over-the-counter medicines that you are currently using as well as attempt any of the following below    You can take Tylenol and/or Ibuprofen as needed for fever reduction and pain relief.   For cough: honey 1/2 to 1 teaspoon (you can dilute the honey in water or another fluid).  You can also use guaifenesin and dextromethorphan for cough. You can use a humidifier for chest congestion and cough.  If you don't have a  humidifier, you can sit in the bathroom with the hot shower running.      For sore throat: try warm salt water gargles, cepacol lozenges, throat spray, warm tea or water with lemon/honey, popsicles or ice, or OTC cold relief medicine for throat discomfort.   For congestion: take a daily anti-histamine like Zyrtec, Claritin, and a oral decongestant, such as pseudoephedrine.  You can also use Flonase 1-2 sprays in each nostril daily.   It is important to stay hydrated: drink plenty of fluids (water, gatorade/powerade/pedialyte, juices, or teas) to keep your throat moisturized and help further relieve irritation/discomfort.    ED Prescriptions     Medication Sig Dispense Auth. Provider   predniSONE (STERAPRED UNI-PAK 21 TAB) 10 MG (21) TBPK tablet Take by mouth daily. Take 6 tabs by mouth daily  for 1 days, then 5 tabs for 1 days, then 4 tabs for 1 days, then 3 tabs for 1 days, 2 tabs for 1 days, then 1 tab by mouth daily for 1 days 21 tablet Inri Sobieski, Elita Boone, NP      PDMP not reviewed this encounter.   Valinda Hoar, NP 11/26/23 1443

## 2023-11-26 NOTE — ED Triage Notes (Signed)
Pt reports cough,sore throat and asthma sx's started last week . Pt reports a Hx of frequent bronchitis.

## 2023-12-01 ENCOUNTER — Ambulatory Visit: Payer: Self-pay

## 2023-12-01 NOTE — Telephone Encounter (Signed)
Message from Ardoch T sent at 12/01/2023 12:19 PM EST  Summary: medication request   Patient called stated she is still having sporadic shortness of breath (not currently) lots of chest congestion and she is requesting another round steroids or if provider can prescribe an antibiotic. Please f/u with patient        Called pt and LM on VM to call back.

## 2023-12-01 NOTE — Telephone Encounter (Signed)
    Chief Complaint: Cough, sob,wheezing Symptoms: Above Frequency: Last week Pertinent Negatives: Patient denies fever Disposition: [] ED /[] Urgent Care (no appt availability in office) / [] Appointment(In office/virtual)/ [x]  [facility]/ [] Home Care/ [] Refused Recommended Disposition /[] [facility]/ []  Follow-up with PCP Additional Notes: Pt. Will try [facility].  Reason for Disposition  [1] MILD difficulty breathing (e.g., minimal/no SOB at rest, SOB with walking, pulse <100) AND [2] still present when not coughing  Answer Assessment - Initial Assessment Questions 1. ONSET: "When did the cough begin?"      Friday last week 2. SEVERITY: "How bad is the cough today?"      Severe 3. SPUTUM: "Describe the color of your sputum" (none, dry cough; clear, white, yellow, green)     Green 4. HEMOPTYSIS: "Are you coughing up any blood?" If so ask: "How much?" (flecks, streaks, tablespoons, etc.)     No 5. DIFFICULTY BREATHING: "Are you having difficulty breathing?" If Yes, ask: "How bad is it?" (e.g., mild, moderate, severe)    - MILD: No SOB at rest, mild SOB with walking, speaks normally in sentences, can lie down, no retractions, pulse < 100.    - MODERATE: SOB at rest, SOB with minimal exertion and prefers to sit, cannot lie down flat, speaks in phrases, mild retractions, audible wheezing, pulse 100-120.    - SEVERE: Very SOB at rest, speaks in single words, struggling to breathe, sitting hunched forward, retractions, pulse > 120      Mild 6. FEVER: "Do you have a fever?" If Yes, ask: "What is your temperature, how was it measured, and when did it start?"     No 7. CARDIAC HISTORY: "Do you have any history of heart disease?" (e.g., heart attack, congestive heart failure)      No 8. LUNG HISTORY: "Do you have any history of lung disease?"  (e.g., pulmonary embolus, asthma, emphysema)     Asthma 9. PE RISK FACTORS: "Do you have a history of blood clots?" (or: recent major  surgery, recent prolonged travel, bedridden)     No 10. OTHER SYMPTOMS: "Do you have any other symptoms?" (e.g., runny nose, wheezing, chest pain)       SOB, wheezing 11. PREGNANCY: "Is there any chance you are pregnant?" "When was your last menstrual period?"       No 12. TRAVEL: "Have you traveled out of the country in the last month?" (e.g., travel history, exposures)       No  Protocols used: Cough - Acute Productive-A-AH

## 2023-12-02 ENCOUNTER — Other Ambulatory Visit: Payer: Self-pay

## 2023-12-02 ENCOUNTER — Telehealth: Payer: 59 | Admitting: Physician Assistant

## 2023-12-02 DIAGNOSIS — J4541 Moderate persistent asthma with (acute) exacerbation: Secondary | ICD-10-CM | POA: Diagnosis not present

## 2023-12-02 DIAGNOSIS — J069 Acute upper respiratory infection, unspecified: Secondary | ICD-10-CM | POA: Diagnosis not present

## 2023-12-02 DIAGNOSIS — B9689 Other specified bacterial agents as the cause of diseases classified elsewhere: Secondary | ICD-10-CM | POA: Diagnosis not present

## 2023-12-02 MED ORDER — AMOXICILLIN-POT CLAVULANATE 875-125 MG PO TABS
1.0000 | ORAL_TABLET | Freq: Two times a day (BID) | ORAL | 0 refills | Status: DC
  Filled 2023-12-02: qty 20, 10d supply, fill #0

## 2023-12-02 MED ORDER — ALBUTEROL SULFATE HFA 108 (90 BASE) MCG/ACT IN AERS
1.0000 | INHALATION_SPRAY | Freq: Four times a day (QID) | RESPIRATORY_TRACT | 0 refills | Status: AC | PRN
  Filled 2023-12-02: qty 6.7, 25d supply, fill #0

## 2023-12-02 MED ORDER — PSEUDOEPH-BROMPHEN-DM 30-2-10 MG/5ML PO SYRP
5.0000 mL | ORAL_SOLUTION | Freq: Four times a day (QID) | ORAL | 0 refills | Status: DC | PRN
  Filled 2023-12-02: qty 118, 6d supply, fill #0

## 2023-12-02 MED ORDER — IPRATROPIUM-ALBUTEROL 0.5-2.5 (3) MG/3ML IN SOLN
3.0000 mL | RESPIRATORY_TRACT | 0 refills | Status: AC | PRN
  Filled 2023-12-02: qty 360, 20d supply, fill #0

## 2023-12-02 MED ORDER — FLUTICASONE PROPIONATE HFA 110 MCG/ACT IN AERO
2.0000 | INHALATION_SPRAY | Freq: Two times a day (BID) | RESPIRATORY_TRACT | 1 refills | Status: AC
  Filled 2023-12-02: qty 12, 30d supply, fill #0

## 2023-12-02 NOTE — Patient Instructions (Signed)
Pamela Hamilton, thank you for joining Margaretann Loveless, PA-C for today's virtual visit.  While this provider is not your primary care provider (PCP), if your PCP is located in our provider database this encounter information will be shared with them immediately following your visit.   A [facility] MyChart account gives you access to today's visit and all your visits, tests, and labs performed at [facility] " click here if you don't have a [facility] MyChart account or go to [email or web address]  Consent: (Patient) Pamela Hamilton provided verbal consent for this virtual visit at the beginning of the encounter.  Current Medications:  Current Outpatient Medications:    albuterol (VENTOLIN HFA) 108 (90 Base) MCG/ACT inhaler, Inhale 1-2 puffs into the lungs every 6 (six) hours as needed., Disp: 6.7 g, Rfl: 0   amoxicillin-clavulanate (AUGMENTIN) 875-125 MG tablet, Take 1 tablet by mouth 2 (two) times daily., Disp: 20 tablet, Rfl: 0   brompheniramine-pseudoephedrine-DM 30-2-10 MG/5ML syrup, Take 5 mLs by mouth 4 (four) times daily as needed., Disp: 118 mL, Rfl: 0   fluticasone (FLOVENT HFA) 110 MCG/ACT inhaler, Inhale 2 puffs into the lungs in the morning and at bedtime., Disp: 12 g, Rfl: 1   ipratropium-albuterol (DUONEB) 0.5-2.5 (3) MG/3ML SOLN, Take 3 mLs by nebulization every 4 (four) hours as needed., Disp: 360 mL, Rfl: 0   acetaminophen (TYLENOL) 500 MG tablet, Take 1,000 mg by mouth 2 (two) times daily as needed for moderate pain or headache., Disp: , Rfl:    fluticasone (FLONASE) 50 MCG/ACT nasal spray, Place 2 sprays into both nostrils daily., Disp: 16 g, Rfl: 6   Hypromellose (ARTIFICIAL TEARS OP), Place 1 drop into both eyes daily as needed (dry eyes)., Disp: , Rfl:    levonorgestrel (MIRENA) 20 MCG/DAY IUD, 1 each by Intrauterine route once for 1 dose., Disp: 1 each, Rfl: 0   Menthol, Topical Analgesic, (ICY HOT EX), Apply 1 application topically daily as  needed (pain)., Disp: , Rfl:    Multiple Vitamin (MULTI-VITAMINS) TABS, Take 1 tablet by mouth every evening. , Disp: , Rfl:    Multiple Vitamins-Minerals (ZINC PO), Take by mouth., Disp: , Rfl:    Omega-3 Fatty Acids (FISH OIL ULTRA) 1400 MG CAPS, Take 1,400 mg by mouth every evening., Disp: , Rfl:    ondansetron (ZOFRAN-ODT) 4 MG disintegrating tablet, Take 1 tablet (4 mg total) by mouth every 6 (six) hours as needed for nausea or vomiting., Disp: 20 tablet, Rfl: 0   oxyCODONE-acetaminophen (PERCOCET) 5-325 MG tablet, Take 2 tablets by mouth every 8 (eight) hours as needed., Disp: 16 tablet, Rfl: 0   predniSONE (STERAPRED UNI-PAK 21 TAB) 10 MG (21) TBPK tablet, Take by mouth daily. Take 6 tabs by mouth daily  for 1 days, then 5 tabs for 1 days, then 4 tabs for 1 days, then 3 tabs for 1 days, 2 tabs for 1 days, then 1 tab by mouth daily for 1 days, Disp: 21 tablet, Rfl: 0   sodium chloride (OCEAN) 0.65 % SOLN nasal spray, Place 1 spray into both nostrils as needed for congestion., Disp: , Rfl:    vitamin C (ASCORBIC ACID) 500 MG tablet, Take 500 mg by mouth every evening., Disp: , Rfl:    Medications ordered in this encounter:  Meds ordered this encounter  Medications   amoxicillin-clavulanate (AUGMENTIN) 875-125 MG tablet    Sig: Take 1 tablet by mouth 2 (two) times daily.    Dispense:  20 tablet  Refill:  0    Supervising Provider:   Merrilee Jansky [[id number]]   fluticasone (FLOVENT HFA) 110 MCG/ACT inhaler    Sig: Inhale 2 puffs into the lungs in the morning and at bedtime.    Dispense:  12 g    Refill:  1    Supervising Provider:   Merrilee Jansky [[id number]]   ipratropium-albuterol (DUONEB) 0.5-2.5 (3) MG/3ML SOLN    Sig: Take 3 mLs by nebulization every 4 (four) hours as needed.    Dispense:  360 mL    Refill:  0    Supervising Provider:   Merrilee Jansky [[id number]]   brompheniramine-pseudoephedrine-DM 30-2-10 MG/5ML syrup    Sig: Take 5 mLs by mouth 4 (four) times daily as  needed.    Dispense:  118 mL    Refill:  0    Supervising Provider:   Merrilee Jansky [[id number]]   albuterol (VENTOLIN HFA) 108 (90 Base) MCG/ACT inhaler    Sig: Inhale 1-2 puffs into the lungs every 6 (six) hours as needed.    Dispense:  6.7 g    Refill:  0    Supervising Provider:   Merrilee Jansky [[id number]]     *If you need refills on other medications prior to your next appointment, please contact your pharmacy*  Follow-Up: Call back or seek an in-person evaluation if the symptoms worsen or if the condition fails to improve as anticipated.  [facility] [phone]  Other Instructions Upper Respiratory Infection, Adult An upper respiratory infection (URI) is a common viral infection of the nose, throat, and upper air passages that lead to the lungs. The most common type of URI is the common cold. URIs usually get better on their own, without medical treatment. What are the causes? A URI is caused by a virus. You may catch a virus by: Breathing in droplets from an infected person's cough or sneeze. Touching something that has been exposed to the virus (is contaminated) and then touching your mouth, nose, or eyes. What increases the risk? You are more likely to get a URI if: You are very young or very old. You have close contact with others, such as at work, school, or a health care facility. You smoke. You have long-term (chronic) heart or lung disease. You have a weakened disease-fighting system (immune system). You have nasal allergies or asthma. You are experiencing a lot of stress. You have poor nutrition. What are the signs or symptoms? A URI usually involves some of the following symptoms: Runny or stuffy (congested) nose. Cough. Sneezing. Sore throat. Headache. Fatigue. Fever. Loss of appetite. Pain in your forehead, behind your eyes, and over your cheekbones (sinus pain). Muscle aches. Redness or irritation of the eyes. Pressure in the ears  or face. How is this diagnosed? This condition may be diagnosed based on your medical history and symptoms, and a physical exam. Your health care provider may use a swab to take a mucus sample from your nose (nasal swab). This sample can be tested to determine what virus is causing the illness. How is this treated? URIs usually get better on their own within 7-10 days. Medicines cannot cure URIs, but your health care provider may recommend certain medicines to help relieve symptoms, such as: Over-the-counter cold medicines. Cough suppressants. Coughing is a type of defense against infection that helps to clear the respiratory system, so take these medicines only as recommended by your health care provider. Fever-reducing medicines. Follow these instructions  at home: Activity Rest as needed. If you have a fever, stay home from work or school until your fever is gone or until your health care provider says your URI cannot spread to other people (is no longer contagious). Your health care provider may have you wear a face mask to prevent your infection from spreading. Relieving symptoms Gargle with a mixture of salt and water 3-4 times a day or as needed. To make salt water, completely dissolve -1 tsp (3-6 g) of salt in 1 cup (237 mL) of warm water. Use a cool-mist humidifier to add moisture to the air. This can help you breathe more easily. Eating and drinking  Drink enough fluid to keep your urine pale yellow. Eat soups and other clear broths. General instructions  Take over-the-counter and prescription medicines only as told by your health care provider. These include cold medicines, fever reducers, and cough suppressants. Do not use any products that contain nicotine or tobacco. These products include cigarettes, chewing tobacco, and vaping devices, such as e-cigarettes. If you need help quitting, ask your health care provider. Stay away from secondhand smoke. Stay up to date on all  immunizations, including the yearly (annual) flu vaccine. Keep all follow-up visits. This is important. How to prevent the spread of infection to others URIs can be contagious. To prevent the infection from spreading: Wash your hands with soap and water for at least 20 seconds. If soap and water are not available, use hand sanitizer. Avoid touching your mouth, face, eyes, or nose. Cough or sneeze into a tissue or your sleeve or elbow instead of into your hand or into the air.  Contact a health care provider if: You are getting worse instead of better. You have a fever or chills. Your mucus is brown or red. You have yellow or brown discharge coming from your nose. You have pain in your face, especially when you bend forward. You have swollen neck glands. You have pain while swallowing. You have white areas in the back of your throat. Get help right away if: You have shortness of breath that gets worse. You have severe or persistent: Headache. Ear pain. Sinus pain. Chest pain. You have chronic lung disease along with any of the following: Making high-pitched whistling sounds when you breathe, most often when you breathe out (wheezing). Prolonged cough (more than 14 days). Coughing up blood. A change in your usual mucus. You have a stiff neck. You have changes in your: Vision. Hearing. Thinking. Mood. These symptoms may be an emergency. Get help right away. Call 911. Do not wait to see if the symptoms will go away. Do not drive yourself to the hospital. Summary An upper respiratory infection (URI) is a common infection of the nose, throat, and upper air passages that lead to the lungs. A URI is caused by a virus. URIs usually get better on their own within 7-10 days. Medicines cannot cure URIs, but your health care provider may recommend certain medicines to help relieve symptoms. This information is not intended to replace advice given to you by your health care provider.  Make sure you discuss any questions you have with your health care provider. Document Revised: 06/24/2021 Document Reviewed: 06/24/2021 Elsevier Patient Education  2024 [employer]    If you have been instructed to have an in-person evaluation today at a local Urgent Care facility, please use the link below. It will take you to a list of all of our available [facility], including  address, phone number and hours of operation. Please do not delay care.  [facility]  If you or a family member do not have a primary care provider, use the link below to schedule a visit and establish care. When you choose a [facility] primary care physician or advanced practice provider, you gain a long-term partner in health. Find a Primary Care Provider  Learn more about [facility] in-office and virtual care options: [facility] - Get Care Now

## 2023-12-02 NOTE — Progress Notes (Signed)
Virtual Visit Consent   Pamela Hamilton, you are scheduled for a virtual visit with a [facility] provider today. Just as with appointments in the office, your consent must be obtained to participate. Your consent will be active for this visit and any virtual visit you may have with one of our providers in the next 365 days. If you have a MyChart account, a copy of this consent can be sent to you electronically.  As this is a virtual visit, video technology does not allow for your provider to perform a traditional examination. This may limit your provider's ability to fully assess your condition. If your provider identifies any concerns that need to be evaluated in person or the need to arrange testing (such as labs, EKG, etc.), we will make arrangements to do so. Although advances in technology are sophisticated, we cannot ensure that it will always work on either your end or our end. If the connection with a video visit is poor, the visit may have to be switched to a telephone visit. With either a video or telephone visit, we are not always able to ensure that we have a secure connection.  By engaging in this virtual visit, you consent to the provision of healthcare and authorize for your insurance to be billed (if applicable) for the services provided during this visit. Depending on your insurance coverage, you may receive a charge related to this service.  I need to obtain your verbal consent now. Are you willing to proceed with your visit today? [name] has provided verbal consent on 12/02/2023 for a virtual visit (video or telephone). Margaretann Loveless, PA-C  Date: 12/02/2023 8:56 AM  Virtual Visit via Video Note   I, Margaretann Loveless, connected with  Pamela Hamilton  ([id number], [birth year]) on 12/02/23 at  8:30 AM EST by a video-enabled telemedicine application and verified that I am speaking with the correct person using two identifiers.  Location: Patient: Virtual  Visit Location Patient: Home Provider: Virtual Visit Location Provider: Home Office   I discussed the limitations of evaluation and management by telemedicine and the availability of in person appointments. The patient expressed understanding and agreed to proceed.    History of Present Illness: Pamela Hamilton is a 42 y.o. who identifies as a female who was assigned female at birth, and is being seen today for shortness of breath and wheezing with cough.  HPI: Cough This is a recurrent problem. The current episode started in the past 7 days (Seen at [facility] on 11/26/23 and diagnosed with Viral URI and given 6 day prednisone taper). The problem has been gradually worsening. The problem occurs every few minutes. The cough is Productive of sputum and productive of purulent sputum. Associated symptoms include chest pain, headaches, nasal congestion, postnasal drip, rhinorrhea, a sore throat (initial, now more intermittent), shortness of breath and wheezing. Pertinent negatives include no chills, ear congestion, ear pain, fever or myalgias. The symptoms are aggravated by lying down. She has tried a beta-agonist inhaler and oral steroids for the symptoms. The treatment provided no relief. Her past medical history is significant for asthma and bronchitis.     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   Family history of breast cancer 07/02/2022   Family history of premature coronary artery  disease 04/27/2018   Mixed hyperlipidemia 04/27/2018    Allergies:  Allergies  Allergen Reactions   Versed [Midazolam]     PT STATES SHE BECOMES VERY DISORIENTED AND LOOPY   Medications:  Current Outpatient Medications:    albuterol (VENTOLIN HFA) 108 (90 Base) MCG/ACT inhaler, Inhale 1-2 puffs  into the lungs every 6 (six) hours as needed., Disp: 6.7 g, Rfl: 0   amoxicillin-clavulanate (AUGMENTIN) 875-125 MG tablet, Take 1 tablet by mouth 2 (two) times daily., Disp: 20 tablet, Rfl: 0   brompheniramine-pseudoephedrine-DM 30-2-10 MG/5ML syrup, Take 5 mLs by mouth 4 (four) times daily as needed., Disp: 118 mL, Rfl: 0   fluticasone (FLOVENT HFA) 110 MCG/ACT inhaler, Inhale 2 puffs into the lungs in the morning and at bedtime., Disp: 12 g, Rfl: 1   ipratropium-albuterol (DUONEB) 0.5-2.5 (3) MG/3ML SOLN, Take 3 mLs by nebulization every 4 (four) hours as needed., Disp: 360 mL, Rfl: 0   acetaminophen (TYLENOL) 500 MG tablet, Take 1,000 mg by mouth 2 (two) times daily as needed for moderate pain or headache., Disp: , Rfl:    fluticasone (FLONASE) 50 MCG/ACT nasal spray, Place 2 sprays into both nostrils daily., Disp: 16 g, Rfl: 6   Hypromellose (ARTIFICIAL TEARS OP), Place 1 drop into both eyes daily as needed (dry eyes)., Disp: , Rfl:    levonorgestrel (MIRENA) 20 MCG/DAY IUD, 1 each by Intrauterine route once for 1 dose., Disp: 1 each, Rfl: 0   Menthol, Topical Analgesic, (ICY HOT EX), Apply 1 application topically daily as needed (pain)., Disp: , Rfl:    Multiple Vitamin (MULTI-VITAMINS) TABS, Take 1 tablet by mouth every evening. , Disp: , Rfl:    Multiple Vitamins-Minerals (ZINC PO), Take by mouth., Disp: , Rfl:    Omega-3 Fatty Acids (FISH OIL ULTRA) 1400 MG CAPS, Take 1,400 mg by mouth every evening., Disp: , Rfl:    ondansetron (ZOFRAN-ODT) 4 MG disintegrating tablet, Take 1 tablet (4 mg total) by mouth every 6 (six) hours as needed for nausea or vomiting., Disp: 20 tablet, Rfl: 0   oxyCODONE-acetaminophen (PERCOCET) 5-325 MG tablet, Take 2 tablets by mouth every 8 (eight) hours as needed., Disp: 16 tablet, Rfl: 0   predniSONE (STERAPRED UNI-PAK 21 TAB) 10 MG (21) TBPK tablet, Take by mouth daily. Take 6 tabs by mouth daily  for 1 days, then 5 tabs for 1 days, then 4 tabs for 1 days, then 3  tabs for 1 days, 2 tabs for 1 days, then 1 tab by mouth daily for 1 days, Disp: 21 tablet, Rfl: 0   sodium chloride (OCEAN) 0.65 % SOLN nasal spray, Place 1 spray into both nostrils as needed for congestion., Disp: , Rfl:    vitamin C (ASCORBIC ACID) 500 MG tablet, Take 500 mg by mouth every evening., Disp: , Rfl:   Observations/Objective: Patient is well-developed, well-nourished in no acute distress.  Resting comfortably at home.  Head is normocephalic, atraumatic.  No labored breathing.  Speech is clear and coherent with logical content.  Patient is alert and oriented at baseline.    Assessment and Plan: 1. Bacterial upper respiratory infection (Primary) - amoxicillin-clavulanate (AUGMENTIN) 875-125 MG tablet; Take 1 tablet by mouth 2 (two) times daily.  Dispense: 20 tablet; Refill: 0 - brompheniramine-pseudoephedrine-DM 30-2-10 MG/5ML syrup; Take 5 mLs by mouth 4 (four) times daily as needed.  Dispense: 118 mL; Refill: 0  2. Moderate persistent asthma with acute exacerbation - fluticasone (FLOVENT HFA) 110 MCG/ACT inhaler; Inhale 2 puffs into the  lungs in the morning and at bedtime.  Dispense: 12 g; Refill: 1 - ipratropium-albuterol (DUONEB) 0.5-2.5 (3) MG/3ML SOLN; Take 3 mLs by nebulization every 4 (four) hours as needed.  Dispense: 360 mL; Refill: 0 - albuterol (VENTOLIN HFA) 108 (90 Base) MCG/ACT inhaler; Inhale 1-2 puffs into the lungs every 6 (six) hours as needed.  Dispense: 6.7 g; Refill: 0  - Worsening over a week despite OTC medications - Will treat with Augmentin - Bromfed DM for cough - Can continue Mucinex (plain) - Albuterol refilled - Added Flovent for better asthma control (has had recurrent bronchitis) - Duoneb prescribed (has nebulizer at home) - Push fluids.  - Rest.  - Steam and humidifier can help - Seek in person evaluation if worsening or symptoms fail to improve    Follow Up Instructions: I discussed the assessment and treatment plan with the patient.  The patient was provided an opportunity to ask questions and all were answered. The patient agreed with the plan and demonstrated an understanding of the instructions.  A copy of instructions were sent to the patient via MyChart unless otherwise noted below.    The patient was advised to call back or seek an in-person evaluation if the symptoms worsen or if the condition fails to improve as anticipated.    Margaretann Loveless, PA-C

## 2023-12-27 ENCOUNTER — Ambulatory Visit: Payer: 59 | Admitting: Cardiovascular Disease

## 2023-12-29 ENCOUNTER — Other Ambulatory Visit: Payer: Self-pay

## 2024-01-02 ENCOUNTER — Encounter: Payer: Self-pay | Admitting: Physician Assistant

## 2024-01-02 ENCOUNTER — Ambulatory Visit: Payer: 59 | Admitting: Physician Assistant

## 2024-01-02 VITALS — BP 121/76 | HR 69 | Temp 98.0°F | Ht 63.0 in | Wt 205.6 lb

## 2024-01-02 DIAGNOSIS — R109 Unspecified abdominal pain: Secondary | ICD-10-CM | POA: Diagnosis not present

## 2024-01-02 DIAGNOSIS — R197 Diarrhea, unspecified: Secondary | ICD-10-CM

## 2024-01-02 DIAGNOSIS — K5900 Constipation, unspecified: Secondary | ICD-10-CM

## 2024-01-02 DIAGNOSIS — Z8719 Personal history of other diseases of the digestive system: Secondary | ICD-10-CM

## 2024-01-02 NOTE — Patient Instructions (Addendum)
Start OTC Benefiber Powder. Mix 1 - 2 Tablespoons in 6 - 8 ounces of a Drink Once Daily. Drink 64 ounces of water / fluids Daily.

## 2024-01-02 NOTE — Progress Notes (Signed)
Pamela Amy, PA-C [address]  [address]  [place], [place] [address]  Main: [phone]  Fax: [phone]   Gastroenterology Consultation  Referring Provider:     Brett Albino* Primary Care Physician:  Ronnald Ramp, MD Primary Gastroenterologist:  Pamela Amy, PA-C  Reason for Consultation:     Generalized abdominal pain        HPI:   CHASTITY NOLAND is a 43 y.o. y/o female referred for consultation & management  by Ronnald Ramp, MD.    Patient went to [facility] ED 11/09/2023 to evaluate acute LLQ pain.  Had chills but no fever.  Had nausea and diarrhea.  Abdominal pelvic CT showed acute uncomplicated sigmoid diverticulitis.  No perforation.  IUD present.  Pelvic ultrasound showed normal ovaries and uterus.  No cyst.  CBC showed elevated white count 15.8.  Normal hemoglobin 15.5.  Negative pregnancy test.  Negative UA.  Normal CMP and lipase.  Diverticulitis was treated with IV Zosyn and discharged home on Augmentin.  Currently she is feeling a lot better.  She has no more LLQ pain.  This was her first episode of diverticulitis.  Patient states that she has had intermittent episodes of abdominal cramping and diarrhea for many years.  Episodes occur once a week or every other week.  Trigger foods include [place] food, milk, greasy or fatty foods.  She has not had any alarm symptoms such as rectal bleeding or weight loss.  She feels like her colon is slow to have a bowel movement.  She has soft bowel movements, however she has to sit on the commode for 10 minutes and strains to have a soft bowel movement.  Feeling of incomplete evacuation.  She has not had any hard stools.  Her mom has IBS.  No family history of colon cancer.  No previous colonoscopy or GI evaluation.  Past Medical History:  Diagnosis Date   Abortion, missed    Allergy    Anemia    Anxiety    Arthritis    Asthma    EXERCISE INDUCED   BRCA negative 07/2022   MyRisk neg  except MSH3 VUS   Family history of breast cancer    genetic testing letter sent 3/19, 7/20; 8/23 IBIS=15.5%/riskscore=9.1%   GERD (gastroesophageal reflux disease)    OCC NO MEDS   Headache    MIGRAINES   History of methicillin resistant staphylococcus aureus (MRSA) 02/2018   NASAL SWAB    History of strep sore throat    Hyperlipidemia    Hypothyroidism     Past Surgical History:  Procedure Laterality Date   BREAST CYST ASPIRATION Right 2008   BREAST CYST ASPIRATION Right 2009   DILATION AND CURETTAGE OF UTERUS     EYE SURGERY     X 3   SHOULDER ARTHROSCOPY Right    TONSILLECTOMY N/A 07/13/2018   Procedure: TONSILLECTOMY;  Surgeon: Bud Face, MD;  Location: [facility] ORS;  Service: ENT;  Laterality: N/A;   TONSILLECTOMY     WISDOM TOOTH EXTRACTION      Prior to Admission medications   Medication Sig Start Date End Date Taking? Authorizing Provider  acetaminophen (TYLENOL) 500 MG tablet Take 1,000 mg by mouth 2 (two) times daily as needed for moderate pain or headache.    [provider]  albuterol (VENTOLIN HFA) 108 (90 Base) MCG/ACT inhaler Inhale 1-2 puffs into the lungs every 6 (six) hours as needed. 12/02/23   Margaretann Loveless, PA-C  fluticasone (FLONASE) 50  MCG/ACT nasal spray Place 2 sprays into both nostrils daily. 10/30/19   Hedges, Tinnie Gens, PA-C  fluticasone (FLOVENT HFA) 110 MCG/ACT inhaler Inhale 2 puffs into the lungs in the morning and at bedtime. 12/02/23   Margaretann Loveless, PA-C  Ginger, Zingiber officinalis, (GINGER ROOT PO) Take by mouth.    [provider]  ipratropium-albuterol (DUONEB) 0.5-2.5 (3) MG/3ML SOLN Take 3 mLs by nebulization every 4 (four) hours as needed. 12/02/23   Margaretann Loveless, PA-C  levonorgestrel (MIRENA) 20 MCG/DAY IUD 1 each by Intrauterine route once for 1 dose. 04/01/22 04/01/22  Copland, Ilona Sorrel, PA-C  Menthol, Topical Analgesic, (ICY HOT EX) Apply 1 application topically daily as needed (pain).     [provider]  Multiple Vitamin (MULTI-VITAMINS) TABS Take 1 tablet by mouth every evening.     [provider]  Multiple Vitamins-Minerals (ZINC PO) Take by mouth.    [provider]  Omega-3 Fatty Acids (FISH OIL ULTRA) 1400 MG CAPS Take 1,400 mg by mouth every evening.    [provider]    Family History  Problem Relation Age of Onset   Hypertension Mother    Hyperlipidemia Mother    Diabetes Mellitus II Mother    Kidney disease Mother    Hypothyroidism Mother    Sleep apnea Mother    Heart disease Father    CAD Father    Hyperlipidemia Father    Sleep apnea Father    Breast cancer Maternal Aunt 53   Diabetes Maternal Grandmother    Hypertension Maternal Grandmother    Heart disease Paternal Grandmother    Heart disease Paternal Grandfather    Breast cancer Cousin 35   Heart disease Other    Breast cancer Other    Breast cancer Other      Social History   Tobacco Use   Smoking status: Never   Smokeless tobacco: Never  Vaping Use   Vaping status: Never Used  Substance Use Topics   Alcohol use: Yes    Alcohol/week: 3.0 - 4.0 standard drinks of alcohol    Types: 3 - 4 Glasses of wine per week    Comment: OCC   Drug use: No    Allergies as of 01/02/2024 - Review Complete 01/02/2024  Allergen Reaction Noted   Versed [midazolam]  07/12/2018    Review of Systems:    All systems reviewed and negative except where noted in HPI.   Physical Exam:  BP 121/76   Pulse 69   Temp 98 F (36.7 C)   Ht 5\' 3"  (1.6 m)   Wt 205 lb 9.6 oz (93.3 kg)   BMI 36.42 kg/m  No LMP recorded. (Menstrual status: IUD).  General:   Alert,  Well-developed, well-nourished, pleasant and cooperative in NAD Lungs:  Respirations even and unlabored.  Clear throughout to auscultation.   No wheezes, crackles, or rhonchi. No acute distress. Heart:  Regular rate and rhythm; no murmurs, clicks, rubs, or gallops. Abdomen:  Normal bowel sounds.  No bruits.   Soft, and non-distended without masses, hepatosplenomegaly or hernias noted.  No Tenderness.  No guarding or rebound tenderness.    Neurologic:  Alert and oriented x3;  grossly normal neurologically. Psych:  Alert and cooperative. Normal mood and affect.  Imaging Studies: No results found.  Assessment and Plan:   VENNELA JUTTE is a 43 y.o. y/o female has been referred for:  1.  Follow-up of acute uncomplicated sigmoid diverticulitis treated 11/09/2023 with Augmentin.  First  episode.  Currently asymptomatic.  Patient education handout given from up-to-date.  Scheduling Colonoscopy I discussed risks of colonoscopy with patient to include risk of bleeding, colon perforation, and risk of sedation.  Patient expressed understanding and agrees to proceed with colonoscopy.   2.  Lower abdominal cramping with intermittent episodes of diarrhea and mild constipation.  Symptoms suspicious for irritable bowel syndrome.  Start OTC Benefiber powder mix 1 tablespoon in a drink once daily.  Low FODMAP diet handout given.  Rx hyoscyamine 0.125 Mg 1 tablet 3 times daily as needed cramping.  Follow up 4 weeks after colonoscopy.  Pamela Amy, PA-C

## 2024-01-09 ENCOUNTER — Encounter: Payer: Self-pay | Admitting: Internal Medicine

## 2024-01-09 ENCOUNTER — Ambulatory Visit: Payer: 59 | Admitting: Internal Medicine

## 2024-01-09 ENCOUNTER — Other Ambulatory Visit: Payer: Self-pay

## 2024-01-09 ENCOUNTER — Other Ambulatory Visit: Admission: RE | Admit: 2024-01-09 | Payer: 59 | Source: Ambulatory Visit | Admitting: Internal Medicine

## 2024-01-09 VITALS — BP 122/80 | HR 67 | Ht 63.0 in | Wt 203.0 lb

## 2024-01-09 DIAGNOSIS — G4710 Hypersomnia, unspecified: Secondary | ICD-10-CM | POA: Diagnosis not present

## 2024-01-09 DIAGNOSIS — G4733 Obstructive sleep apnea (adult) (pediatric): Secondary | ICD-10-CM

## 2024-01-09 DIAGNOSIS — K2100 Gastro-esophageal reflux disease with esophagitis, without bleeding: Secondary | ICD-10-CM

## 2024-01-09 DIAGNOSIS — J4520 Mild intermittent asthma, uncomplicated: Secondary | ICD-10-CM | POA: Diagnosis not present

## 2024-01-09 LAB — POCT EXHALED NITRIC OXIDE: FeNO level (ppb): 21

## 2024-01-09 MED ORDER — PANTOPRAZOLE SODIUM 20 MG PO TBEC
20.0000 mg | DELAYED_RELEASE_TABLET | Freq: Every day | ORAL | 1 refills | Status: DC
  Filled 2024-01-09: qty 30, 30d supply, fill #0

## 2024-01-09 NOTE — Progress Notes (Unsigned)
Name: TEMIMA KUTSCH MRN: [id number] DOB: [birth year]    CHIEF COMPLAINT:  EXCESSIVE DAYTIME SLEEPINESS Assessment for sleep apnea Assessment of asthma   HISTORY OF PRESENT ILLNESS: Patient is seen today for problems and issues with sleep related to excessive daytime sleepiness Patient  has been having sleep problems for many years Patient has been having excessive daytime sleepiness for a long time Patient has been having extreme fatigue and tiredness, lack of energy +  very Loud snoring every night + struggling breathe at night and gasps for air + morning headaches + Nonrefreshing sleep  Discussed sleep data and reviewed with patient.  Encouraged proper weight management.  Discussed driving precautions and its relationship with hypersomnolence.  Discussed operating dangerous equipment and its relationship with hypersomnolence.  Discussed sleep hygiene, and benefits of a fixed sleep waked time.  The importance of getting eight or more hours of sleep discussed with patient.  Discussed limiting the use of the computer and television before bedtime.  Decrease naps during the day, so night time sleep will become enhanced.  Limit caffeine, and sleep deprivation.  HTN, stroke, and heart failure are potential risk factors.    EPWORTH SLEEP SCORE  17  Assessment of asthma Patient has has asthma no send 2004 Mostly exercise-induced Cold here and already here exacerbates her asthma Patient has 2 exacerbations per year spring and fall Patient getting over an acute asthma exacerbation this past 4 weeks Seems to have a slow recovery Had been on antibiotics and prednisone Currently on Flovent HFA No significant respiratory distress at this time No wheezing on examination  No exacerbation at this time No evidence of heart failure at this time No evidence or signs of infection at this time No respiratory distress No fevers, chills, nausea, vomiting, diarrhea No evidence of  lower extremity edema No evidence hemoptysis  Current weight 200 pounds Goal weight 145 pounds at 5 3   PAST MEDICAL HISTORY :   has a past medical history of Abortion, missed, Allergy, Anemia, Anxiety, Arthritis, Asthma, BRCA negative (08/43), Family history of breast cancer, GERD (gastroesophageal reflux disease), Headache, History of methicillin resistant staphylococcus aureus (MRSA) (02/2018), History of strep sore throat, Hyperlipidemia, and Hypothyroidism.  has a past surgical history that includes Dilation and curettage of uterus; Breast cyst aspiration (Right, 2008); Breast cyst aspiration (Right, 2009); Shoulder arthroscopy (Right); Eye surgery; Wisdom tooth extraction; Tonsillectomy (N/A, 07/13/42); and Tonsillectomy. Prior to Admission medications   Medication Sig Start Date End Date Taking? Authorizing Provider  acetaminophen (TYLENOL) 500 MG tablet Take 1,000 mg by mouth 2 (two) times daily as needed for moderate pain or headache.    [provider]  albuterol (VENTOLIN HFA) 108 (90 Base) MCG/ACT inhaler Inhale 1-2 puffs into the lungs every 6 (six) hours as needed. 12/02/23   Margaretann Loveless, PA-C  fluticasone (FLONASE) 50 MCG/ACT nasal spray Place 2 sprays into both nostrils daily. 10/30/19   Hedges, Tinnie Gens, PA-C  fluticasone (FLOVENT HFA) 110 MCG/ACT inhaler Inhale 2 puffs into the lungs in the morning and at bedtime. 12/02/23   Margaretann Loveless, PA-C  Ginger, Zingiber officinalis, (GINGER ROOT PO) Take by mouth.    [provider]  ipratropium-albuterol (DUONEB) 0.5-2.5 (3) MG/3ML SOLN Take 3 mLs by nebulization every 4 (four) hours as needed. 12/02/23   Margaretann Loveless, PA-C  levonorgestrel (MIRENA) 20 MCG/DAY IUD 1 each by Intrauterine route once for 1 dose. 04/01/42 04/01/42  Copland, Ilona Sorrel, PA-C  Menthol, Topical Analgesic, (ICY  HOT EX) Apply 1 application topically daily as needed (pain).    [provider]  Multiple Vitamin  (MULTI-VITAMINS) TABS Take 1 tablet by mouth every evening.     [provider]  Multiple Vitamins-Minerals (ZINC PO) Take by mouth.    [provider]  Omega-3 Fatty Acids (FISH OIL ULTRA) 1400 MG CAPS Take 1,400 mg by mouth every evening.    [provider]   Allergies  Allergen Reactions   Versed [Midazolam]     PT STATES SHE BECOMES VERY DISORIENTED AND LOOPY    FAMILY HISTORY:  family history includes Breast cancer in some other family members; Breast cancer (age of onset: 72) in her cousin; Breast cancer (age of onset: 86) in her maternal aunt; CAD in her father; Diabetes in her maternal grandmother; Diabetes Mellitus II in her mother; Heart disease in her father, paternal grandfather, paternal grandmother, and another family member; Hyperlipidemia in her father and mother; Hypertension in her maternal grandmother and mother; Hypothyroidism in her mother; Kidney disease in her mother; Sleep apnea in her father and mother. SOCIAL HISTORY:  reports that she has never smoked. She has never used smokeless tobacco. She reports current alcohol use of about 3.0 - 4.0 standard drinks of alcohol per week. She reports that she does not use drugs.   Review of Systems:  Gen:  Denies  fever, sweats, chills weight loss  HEENT: Denies blurred vision, double vision, ear pain, eye pain, hearing loss, nose bleeds, sore throat Cardiac:  No dizziness, chest pain or heaviness, chest tightness,edema, No JVD Resp:   No cough, -sputum production, -shortness of breath,-wheezing, -hemoptysis,  Gi: Denies swallowing difficulty, stomach pain, nausea or vomiting, diarrhea, constipation, bowel incontinence Gu:  Denies bladder incontinence, burning urine Ext:   Denies Joint pain, stiffness or swelling Skin: Denies  skin rash, easy bruising or bleeding or hives Endoc:  Denies polyuria, polydipsia , polyphagia or weight change Psych:   Denies depression, insomnia or hallucinations  Other:   All other systems negative   ALL OTHER ROS ARE NEGATIVE  BP 122/80   Pulse 67   Ht 5\' 3"  (1.6 m)   Wt 203 lb (92.1 kg)   SpO2 97%   BMI 35.96 kg/m    Physical Examination:   General Appearance: No distress  EYES PERRLA, EOM intact.   NECK Supple, No JVD ORAL CAVITY MALLAMPATI 4 Pulmonary: normal breath sounds, No wheezing.  CardiovascularNormal S1,S2.  No m/r/g.   Abdomen: Benign, Soft, non-tender. Skin:   warm, no rashes, no ecchymosis  Extremities: normal, no cyanosis, clubbing. Neuro:without focal findings,  speech normal  PSYCHIATRIC: Mood, affect within normal limits.   ALL OTHER ROS ARE NEGATIVE    ASSESSMENT AND PLAN  43 yo white female Patient with signs and symptoms of excessive daytime sleepiness with probable underlying diagnosis of obstructive sleep apnea in the setting of obesity and deconditioned state, also diagnosis of underlying asthma   Recommend Sleep Study for definitve diagnosis Recommend home sleep study Be aware of reduced alertness and do not drive or operate heavy machinery if experiencing this or drowsiness.  Exercise encouraged, as tolerated. Encouraged proper weight management.  Important to get eight or more hours of sleep  Limiting the use of the computer and television before bedtime.  Decrease naps during the day, so night time sleep will become enhanced.  Limit caffeine, and sleep deprivation.  HTN, stroke, uncontrolled diabetes and heart failure are potential risk factors.  Risk of untreated sleep  apnea including cardiac arrhthymias, stroke, DM, pulm HTN.    Obesity -recommend significant weight loss -recommend changing diet  Deconditioned state -Recommend increased daily activity and exercise  Asthma exercise-induced 2 exacerbations per year otherwise well-controlled Currently on Flovent HFA Uses albuterol as needed No indication for antibiotics or prednisone at this time  Positive history for QuantiFERON gold Repeat  test ordered and pending   MEDICATION ADJUSTMENTS/LABS AND TESTS ORDERED: Recommend home sleep study to assess for sleep apnea Continue inhalers as prescribed with Flovent HFA Continue albuterol as needed Obtain QuantiFERON gold test Avoid Allergens and Irritants Avoid secondhand smoke Avoid SICK contacts Recommend  Masking  when appropriate Recommend Keep up-to-date with vaccinations Recommend weight loss goal weight under 45 pounds Recommend starting Protonix for reflux   CURRENT MEDICATIONS REVIEWED AT LENGTH WITH PATIENT TODAY   Patient  satisfied with Plan of action and management. All questions answered  Follow up 4 weeks    I spent a total of  65 minutes reviewing chart data, face-to-face evaluation with the patient, counseling and coordination of care as detailed above.    Lucie Leather, M.D.  [facility]  Medical Director [facility] Medical Director [facility] Cardio-Pulmonary Department

## 2024-01-09 NOTE — Patient Instructions (Addendum)
Recommend home sleep study to assess for sleep apnea Continue inhalers as prescribed with Flovent HFA Continue albuterol as needed Obtain QuantiFERON gold test  Avoid Allergens and Irritants Avoid secondhand smoke Avoid SICK contacts Recommend  Masking  when appropriate Recommend Keep up-to-date with vaccinations  Recommend weight loss goal weight under 45 pounds Recommend starting Protonix for reflux   Be aware of reduced alertness and do not drive or operate heavy machinery if experiencing this or drowsiness.  Exercise encouraged, as tolerated. Encouraged proper weight management.  Important to get eight or more hours of sleep  Limiting the use of the computer and television before bedtime.  Decrease naps during the day, so night time sleep will become enhanced.  Limit caffeine, and sleep deprivation.

## 2024-01-10 ENCOUNTER — Other Ambulatory Visit: Payer: Self-pay

## 2024-01-10 ENCOUNTER — Telehealth: Payer: Self-pay

## 2024-01-10 DIAGNOSIS — R197 Diarrhea, unspecified: Secondary | ICD-10-CM

## 2024-01-10 NOTE — Telephone Encounter (Signed)
Patient called office -she wants to schedule Colonoscopy with Dr.Anna 02-09-24 -suprep sent to pharmacy- mailed instructions.

## 2024-01-12 ENCOUNTER — Other Ambulatory Visit
Admission: RE | Admit: 2024-01-12 | Discharge: 2024-01-12 | Disposition: A | Discharge status: Home / Self Care | Payer: 59 | Source: Ambulatory Visit | Attending: Internal Medicine | Admitting: Internal Medicine

## 2024-01-12 DIAGNOSIS — J4520 Mild intermittent asthma, uncomplicated: Secondary | ICD-10-CM | POA: Insufficient documentation

## 2024-01-16 DIAGNOSIS — G4733 Obstructive sleep apnea (adult) (pediatric): Secondary | ICD-10-CM

## 2024-01-18 LAB — QUANTIFERON-TB GOLD PLUS (RQFGPL)
QuantiFERON Mitogen Value: >10 [IU]/mL
QuantiFERON Nil Value: 0.04 [IU]/mL
QuantiFERON TB1 Ag Value: 0.21 [IU]/mL
QuantiFERON TB2 Ag Value: 0.18 [IU]/mL

## 2024-01-18 LAB — QUANTIFERON-TB GOLD PLUS: QuantiFERON-TB Gold Plus: NEGATIVE

## 2024-01-29 DIAGNOSIS — G4733 Obstructive sleep apnea (adult) (pediatric): Secondary | ICD-10-CM | POA: Diagnosis not present

## 2024-02-02 ENCOUNTER — Telehealth: Payer: Self-pay

## 2024-02-02 ENCOUNTER — Other Ambulatory Visit: Payer: Self-pay

## 2024-02-02 MED ORDER — GOLYTELY 236 G PO SOLR
4000.0000 mL | Freq: Once | ORAL | 0 refills | Status: AC
  Filled 2024-02-02: qty 4000, 1d supply, fill #0

## 2024-02-02 NOTE — Telephone Encounter (Signed)
 Call returned to patient to let her know that I will send in her Suprep bowel prep to her pharmacy.  She requested it to go to [facility].  Called patient back to let her know Suprep was not covered by insurance asked if she was okay with Golytely prep.  She said yes this is fine.  Instructions for Golytely sent via mychart.  Thanks, [place], [place]

## 2024-02-09 ENCOUNTER — Encounter
Admission: RE | Disposition: A | Discharge status: Home / Self Care | Payer: Self-pay | Source: Ambulatory Visit | Attending: Gastroenterology

## 2024-02-09 ENCOUNTER — Ambulatory Visit: Admitting: Registered Nurse

## 2024-02-09 ENCOUNTER — Encounter: Payer: Self-pay | Admitting: Gastroenterology

## 2024-02-09 ENCOUNTER — Ambulatory Visit
Admission: RE | Admit: 2024-02-09 | Discharge: 2024-02-09 | Disposition: A | Discharge status: Home / Self Care | Payer: 59 | Source: Ambulatory Visit | Attending: Gastroenterology | Admitting: Gastroenterology

## 2024-02-09 DIAGNOSIS — K219 Gastro-esophageal reflux disease without esophagitis: Secondary | ICD-10-CM | POA: Insufficient documentation

## 2024-02-09 DIAGNOSIS — J45909 Unspecified asthma, uncomplicated: Secondary | ICD-10-CM | POA: Diagnosis not present

## 2024-02-09 DIAGNOSIS — R197 Diarrhea, unspecified: Secondary | ICD-10-CM

## 2024-02-09 DIAGNOSIS — K5730 Diverticulosis of large intestine without perforation or abscess without bleeding: Secondary | ICD-10-CM | POA: Insufficient documentation

## 2024-02-09 DIAGNOSIS — Z09 Encounter for follow-up examination after completed treatment for conditions other than malignant neoplasm: Secondary | ICD-10-CM | POA: Diagnosis not present

## 2024-02-09 DIAGNOSIS — K5732 Diverticulitis of large intestine without perforation or abscess without bleeding: Secondary | ICD-10-CM

## 2024-02-09 HISTORY — PX: COLONOSCOPY WITH PROPOFOL: SHX5780

## 2024-02-09 LAB — POCT PREGNANCY, URINE: Preg Test, Ur: NEGATIVE

## 2024-02-09 SURGERY — COLONOSCOPY WITH PROPOFOL
Anesthesia: General

## 2024-02-09 MED ORDER — SODIUM CHLORIDE 0.9 % IV SOLN: INTRAVENOUS | Status: DC

## 2024-02-09 MED ORDER — PROPOFOL 10 MG/ML IV BOLUS
INTRAVENOUS | Status: DC | PRN
  Administered 2024-02-09: 30 mg via INTRAVENOUS
  Administered 2024-02-09: 70 mg via INTRAVENOUS

## 2024-02-09 MED ORDER — PROPOFOL 500 MG/50ML IV EMUL
INTRAVENOUS | Status: DC | PRN
  Administered 2024-02-09: 140 ug/kg/min via INTRAVENOUS

## 2024-02-09 NOTE — Anesthesia Postprocedure Evaluation (Signed)
 Anesthesia Post Note  Patient: Pamela Hamilton  Procedure(s) Performed: COLONOSCOPY WITH PROPOFOL  Patient location during evaluation: Endoscopy Anesthesia Type: General Level of consciousness: awake and alert Pain management: pain level controlled Vital Signs Assessment: post-procedure vital signs reviewed and stable Respiratory status: spontaneous breathing, nonlabored ventilation, respiratory function stable and patient connected to nasal cannula oxygen Cardiovascular status: blood pressure returned to baseline and stable Postop Assessment: no apparent nausea or vomiting Anesthetic complications: no   No notable events documented.   Last Vitals:  Vitals:   02/09/24 1042 02/09/24 1052  BP: 95/65 103/70  Pulse: 68 67  Resp: 19 16  Temp:    SpO2: 97% 100%    Last Pain:  Vitals:   02/09/24 1052  TempSrc:   PainSc: 0-No pain                 Lenard Simmer

## 2024-02-09 NOTE — Op Note (Signed)
 [facility] Gastroenterology Patient Name: Pamela Hamilton Procedure Date: 02/09/2024 10:10 AM MRN: [id number] Account #: [id number] Date of Birth: May 17, 1981 Admit Type: Outpatient Age: 43 Room: [facility] ENDO ROOM 2 Gender: Female Note Status: Finalized Instrument Name: Colonoscope [id number] Procedure:             Colonoscopy Indications:           Follow-up of diverticulitis Providers:             Wyline Mood MD, MD Referring MD:          No Local Md, MD (Referring MD) Medicines:             Monitored Anesthesia Care Complications:         No immediate complications. Procedure:             Pre-Anesthesia Assessment:                        - Prior to the procedure, a History and Physical was                         performed, and patient medications, allergies and                         sensitivities were reviewed. The patient's tolerance                         of previous anesthesia was reviewed.                        - The risks and benefits of the procedure and the                         sedation options and risks were discussed with the                         patient. All questions were answered and informed                         consent was obtained.                        - ASA Grade Assessment: II - A patient with mild                         systemic disease.                        After obtaining informed consent, the colonoscope was                         passed under direct vision. Throughout the procedure,                         the patient's blood pressure, pulse, and oxygen                         saturations were monitored continuously. The                         Colonoscope was introduced through  the anus and                         advanced to the the cecum, identified by the                         appendiceal orifice. The colonoscopy was performed                         with ease. The patient tolerated the procedure well.                          The quality of the bowel preparation was excellent.                         The ileocecal valve, appendiceal orifice, and rectum                         were photographed. Findings:      The perianal and digital rectal examinations were normal.      Multiple medium-mouthed diverticula were found in the sigmoid colon.      The exam was otherwise without abnormality on direct and retroflexion       views. Impression:            - Diverticulosis in the sigmoid colon.                        - The examination was otherwise normal on direct and                         retroflexion views.                        - No specimens collected. Recommendation:        - Discharge patient to home (with escort).                        - Resume previous diet.                        - Continue present medications.                        - Repeat colonoscopy in 5 years for screening purposes. Procedure Code(s):     --- Professional ---                        [phone], Colonoscopy, flexible; diagnostic, including                         collection of specimen(s) by brushing or washing, when                         performed (separate procedure) Diagnosis Code(s):     --- Professional ---                        X91.47, Diverticulitis of large intestine without                         perforation  or abscess without bleeding                        K57.30, Diverticulosis of large intestine without                         perforation or abscess without bleeding CPT copyright 2022 American Medical Association. All rights reserved. The codes documented in this report are preliminary and upon coder review may  be revised to meet current compliance requirements. Wyline Mood, MD Wyline Mood MD, MD 02/09/2024 16:10:96 AM This report has been signed electronically. Number of Addenda: 0 Note Initiated On: 02/09/2024 10:10 AM Scope Withdrawal Time: 0 hours 9 minutes 24 seconds  Total Procedure Duration: 0 hours 12  minutes 13 seconds  Estimated Blood Loss:  Estimated blood loss: none.      [facility]

## 2024-02-09 NOTE — Anesthesia Preprocedure Evaluation (Signed)
 Anesthesia Evaluation  Patient identified by MRN, date of birth, ID band Patient awake    Reviewed: Allergy & Precautions, H&P , NPO status , Patient's Chart, lab work & pertinent test results, reviewed documented beta blocker date and time   History of Anesthesia Complications Negative for: history of anesthetic complications  Airway Mallampati: I  TM Distance: >3 FB Neck ROM: full    Dental  (+) Dental Advidsory Given, Teeth Intact, Chipped   Pulmonary neg shortness of breath, asthma , neg COPD, neg recent URI   Pulmonary exam normal breath sounds clear to auscultation       Cardiovascular Exercise Tolerance: Good negative cardio ROS Normal cardiovascular exam Rhythm:regular Rate:Normal     Neuro/Psych  PSYCHIATRIC DISORDERS Anxiety     negative neurological ROS     GI/Hepatic Neg liver ROS,GERD  Medicated and Controlled,,  Endo/Other  neg diabetesHypothyroidism    Renal/GU negative Renal ROS  negative genitourinary   Musculoskeletal   Abdominal   Peds  Hematology negative hematology ROS (+)   Anesthesia Other Findings Past Medical History: No date: Abortion, missed No date: Allergy No date: Anemia No date: Anxiety No date: Arthritis No date: Asthma     Comment:  EXERCISE INDUCED 07/2022: BRCA negative     Comment:  MyRisk neg except MSH3 VUS No date: Family history of breast cancer     Comment:  genetic testing letter sent 3/19, 7/20; 8/23               IBIS=15.5%/riskscore=9.1% No date: GERD (gastroesophageal reflux disease)     Comment:  OCC NO MEDS No date: Headache     Comment:  MIGRAINES 02/2018: History of methicillin resistant staphylococcus aureus (MRSA)     Comment:  NASAL SWAB  No date: History of strep sore throat No date: Hyperlipidemia No date: Hypothyroidism   Reproductive/Obstetrics negative OB ROS                             Anesthesia  Physical Anesthesia Plan  ASA: 2  Anesthesia Plan: General   Post-op Pain Management:    Induction: Intravenous  PONV Risk Score and Plan: 3 and Propofol infusion, TIVA and Treatment may vary due to age or medical condition  Airway Management Planned: Natural Airway and Nasal Cannula  Additional Equipment:   Intra-op Plan:   Post-operative Plan:   Informed Consent: I have reviewed the patients History and Physical, chart, labs and discussed the procedure including the risks, benefits and alternatives for the proposed anesthesia with the patient or authorized representative who has indicated his/her understanding and acceptance.     Dental Advisory Given  Plan Discussed with: Anesthesiologist, CRNA and Surgeon  Anesthesia Plan Comments:        Anesthesia Quick Evaluation

## 2024-02-09 NOTE — Transfer of Care (Signed)
 Immediate Anesthesia Transfer of Care Note  Patient: Pamela Hamilton  Procedure(s) Performed: COLONOSCOPY WITH PROPOFOL  Patient Location: PACU  Anesthesia Type:General  Level of Consciousness: awake, alert , and oriented  Airway & Oxygen Therapy: Patient Spontanous Breathing  Post-op Assessment: Report given to RN and Post -op Vital signs reviewed and stable  Post vital signs: Reviewed and stable  Last Vitals:  Vitals Value Taken Time  BP 95/65 02/09/24 1042  Temp 36.5 C 02/09/24 1032  Pulse 69 02/09/24 1043  Resp 22 02/09/24 1043  SpO2 100 % 02/09/24 1043  Vitals shown include unfiled device data.  Last Pain:  Vitals:   02/09/24 1032  TempSrc: Tympanic  PainSc: Asleep         Complications: No notable events documented.

## 2024-02-09 NOTE — H&P (Signed)
 Pamela Mood, MD [address], [address], [place], [place], [address] [address], [address], [place], [place], [address] Phone: [phone]  Fax: [phone]  Primary Care Physician:  Ronnald Ramp, MD   Pre-Procedure History & Physical: HPI:  Pamela Hamilton is a 43 y.o. female is here for an colonoscopy.   Past Medical History:  Diagnosis Date   Abortion, missed    Allergy    Anemia    Anxiety    Arthritis    Asthma    EXERCISE INDUCED   BRCA negative 07/2022   MyRisk neg except MSH3 VUS   Family history of breast cancer    genetic testing letter sent 3/19, 7/20; 8/23 IBIS=15.5%/riskscore=9.1%   GERD (gastroesophageal reflux disease)    OCC NO MEDS   Headache    MIGRAINES   History of methicillin resistant staphylococcus aureus (MRSA) 02/2018   NASAL SWAB    History of strep sore throat    Hyperlipidemia    Hypothyroidism     Past Surgical History:  Procedure Laterality Date   BREAST CYST ASPIRATION Right 2008   BREAST CYST ASPIRATION Right 2009   DILATION AND CURETTAGE OF UTERUS     EYE SURGERY     X 3   SHOULDER ARTHROSCOPY Right    TONSILLECTOMY N/A 07/13/2018   Procedure: TONSILLECTOMY;  Surgeon: Bud Face, MD;  Location: [facility] ORS;  Service: ENT;  Laterality: N/A;   TONSILLECTOMY     WISDOM TOOTH EXTRACTION      Prior to Admission medications   Medication Sig Start Date End Date Taking? Authorizing Provider  fluticasone (FLONASE) 50 MCG/ACT nasal spray Place 2 sprays into both nostrils daily. 10/30/19  Yes Hedges, Tinnie Gens, PA-C  Ginger, Zingiber officinalis, (GINGER ROOT PO) Take by mouth.   Yes [provider]  Ginkgo Biloba 40 MG TABS Take by mouth.   Yes [provider]  Multiple Vitamin (MULTI-VITAMINS) TABS Take 1 tablet by mouth every evening.    Yes [provider]  Multiple Vitamins-Minerals (ZINC PO) Take by mouth.   Yes [provider]  Omega-3 Fatty Acids (FISH OIL ULTRA) 1400  MG CAPS Take 1,400 mg by mouth every evening.   Yes [provider]  pantoprazole (PROTONIX) 20 MG tablet Take 1 tablet (20 mg total) by mouth daily. 01/09/24 01/08/25 Yes Erin Fulling, MD  PROTEIN PO Take by mouth.   Yes [provider]  Wheat Dextrin (BENEFIBER PO) Take by mouth.   Yes [provider]  acetaminophen (TYLENOL) 500 MG tablet Take 1,000 mg by mouth 2 (two) times daily as needed for moderate pain or headache.    [provider]  albuterol (VENTOLIN HFA) 108 (90 Base) MCG/ACT inhaler Inhale 1-2 puffs into the lungs every 6 (six) hours as needed. 12/02/23   Margaretann Loveless, PA-C  fluticasone (FLOVENT HFA) 110 MCG/ACT inhaler Inhale 2 puffs into the lungs in the morning and at bedtime. 12/02/23   Margaretann Loveless, PA-C  ipratropium-albuterol (DUONEB) 0.5-2.5 (3) MG/3ML SOLN Take 3 mLs by nebulization every 4 (four) hours as needed. 12/02/23   Margaretann Loveless, PA-C  levonorgestrel (MIRENA) 20 MCG/DAY IUD 1 each by Intrauterine route once for 1 dose. 04/01/22 12/06/28  Copland, Ilona Sorrel, PA-C  Menthol, Topical Analgesic, (ICY HOT EX) Apply 1 application topically daily as needed (pain).    [provider]    Allergies as of 01/11/2024 - Review Complete 01/09/2024  Allergen Reaction Noted   Versed [  midazolam]  07/12/2018    Family History  Problem Relation Age of Onset   Hypertension Mother    Hyperlipidemia Mother    Diabetes Mellitus II Mother    Kidney disease Mother    Hypothyroidism Mother    Sleep apnea Mother    Heart disease Father    CAD Father    Hyperlipidemia Father    Sleep apnea Father    Breast cancer Maternal Aunt 50   Diabetes Maternal Grandmother    Hypertension Maternal Grandmother    Heart disease Paternal Grandmother    Heart disease Paternal Grandfather    Breast cancer Cousin 35   Heart disease Other    Breast cancer Other    Breast cancer Other     Social History   Socioeconomic History    Marital status: Married    Spouse name: Not on file   Number of children: Not on file   Years of education: Not on file   Highest education level: Not on file  Occupational History   Not on file  Tobacco Use   Smoking status: Never   Smokeless tobacco: Never  Vaping Use   Vaping status: Never Used  Substance and Sexual Activity   Alcohol use: Yes    Alcohol/week: 3.0 - 4.0 standard drinks of alcohol    Types: 3 - 4 Glasses of wine per week    Comment: OCC   Drug use: No   Sexual activity: Yes    Birth control/protection: I.U.D.    Comment: Mirena  Other Topics Concern   Not on file  Social History Narrative   Not on file   Social Drivers of Health   Financial Resource Strain: Not on file  Food Insecurity: Not on file  Transportation Needs: Not on file  Physical Activity: Not on file  Stress: Not on file  Social Connections: Not on file  Intimate Partner Violence: Not on file    Review of Systems: See HPI, otherwise negative ROS  Physical Exam: There were no vitals taken for this visit. General:   Alert,  pleasant and cooperative in NAD Head:  Normocephalic and atraumatic. Neck:  Supple; no masses or thyromegaly. Lungs:  Clear throughout to auscultation, normal respiratory effort.    Heart:  +S1, +S2, Regular rate and rhythm, No edema. Abdomen:  Soft, nontender and nondistended. Normal bowel sounds, without guarding, and without rebound.   Neurologic:  Alert and  oriented x4;  grossly normal neurologically.  Impression/Plan: Pamela Hamilton is here for an colonoscopy to be performed for diverticulitis   Risks, benefits, limitations, and alternatives regarding  colonoscopy have been reviewed with the patient.  Questions have been answered.  All parties agreeable.   Pamela Mood, MD  02/09/2024, 9:39 AM

## 2024-02-10 ENCOUNTER — Encounter: Payer: Self-pay | Admitting: Gastroenterology

## 2024-02-21 DIAGNOSIS — H18453 Nodular corneal degeneration, bilateral: Secondary | ICD-10-CM | POA: Diagnosis not present

## 2024-02-26 DIAGNOSIS — Z8709 Personal history of other diseases of the respiratory system: Secondary | ICD-10-CM | POA: Diagnosis not present

## 2024-02-26 DIAGNOSIS — Z03818 Encounter for observation for suspected exposure to other biological agents ruled out: Secondary | ICD-10-CM | POA: Diagnosis not present

## 2024-02-26 DIAGNOSIS — J101 Influenza due to other identified influenza virus with other respiratory manifestations: Secondary | ICD-10-CM | POA: Diagnosis not present

## 2024-03-23 ENCOUNTER — Ambulatory Visit
Admission: EM | Admit: 2024-03-23 | Discharge: 2024-03-23 | Disposition: A | Discharge status: Home / Self Care | Attending: Emergency Medicine | Admitting: Emergency Medicine

## 2024-03-23 DIAGNOSIS — J4521 Mild intermittent asthma with (acute) exacerbation: Secondary | ICD-10-CM

## 2024-03-23 DIAGNOSIS — J0101 Acute recurrent maxillary sinusitis: Secondary | ICD-10-CM

## 2024-03-23 MED ORDER — AMOXICILLIN-POT CLAVULANATE 875-125 MG PO TABS: 1.0000 | ORAL_TABLET | Freq: Two times a day (BID) | ORAL | 0 refills | Status: DC

## 2024-03-23 MED ORDER — PREDNISONE 10 MG PO TABS: 40.0000 mg | ORAL_TABLET | Freq: Every day | ORAL | 0 refills | Status: AC

## 2024-03-23 NOTE — ED Provider Notes (Signed)
 Pamela Hamilton    CSN: [id number] Arrival date & time: 03/23/24  1731      History   Chief Complaint Chief Complaint  Patient presents with   Cough    HPI Pamela Hamilton is a 43 y.o. female.  Patient presents with 1 week history of sinus pressure, congestion, sore throat, cough.  Her symptoms have intermittently occurred since she had influenza several weeks ago.  She denies fever.  She has had to use her albuterol  inhaler or nebulizer treatments more often.  Her cough is nonproductive.  Patient was seen at [facility] clinic on 02/26/2024; diagnosed with influenza A; treated with Tamiflu, Zithromax, Promethazine  DM.  Patient's medical history includes asthma; she is followed by pulmonology; last seen on 01/09/2024.  The history is provided by the patient and medical records.    Past Medical History:  Diagnosis Date   Abortion, missed    Allergy    Anemia    Anxiety    Arthritis    Asthma    EXERCISE INDUCED   BRCA negative 07/2022   MyRisk neg except MSH3 VUS   Family history of breast cancer    genetic testing letter sent 3/19, 7/20; 8/23 IBIS=15.5%/riskscore=9.1%   GERD (gastroesophageal reflux disease)    OCC NO MEDS   Headache    MIGRAINES   History of methicillin resistant staphylococcus aureus (MRSA) 02/2018   NASAL SWAB    History of strep sore throat    Hyperlipidemia    Hypothyroidism     Patient Active Problem List   Diagnosis Date Noted   Diverticulitis of colon 02/09/2024   Abnormal thyroid  blood test 10/04/2023   Generalized abdominal pain 10/04/2023   Inattention 10/04/2023   Excessive sleepiness 10/04/2023   Bronchitis, 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   Family history of breast cancer 07/02/2022   Family history of premature coronary artery disease 04/27/2018   Mixed hyperlipidemia 04/27/2018    Past Surgical History:  Procedure Laterality Date   BREAST CYST ASPIRATION Right 2008   BREAST CYST ASPIRATION Right 2009    COLONOSCOPY WITH PROPOFOL  N/A 02/09/2024   Procedure: COLONOSCOPY WITH PROPOFOL ;  Surgeon: Therisa Bi, MD;  Location: [facility] ENDOSCOPY;  Service: Gastroenterology;  Laterality: N/A;   DILATION AND CURETTAGE OF UTERUS     EYE SURGERY     X 3   SHOULDER ARTHROSCOPY Right    TONSILLECTOMY N/A 07/13/2018   Procedure: TONSILLECTOMY;  Surgeon: Milissa Hamming, MD;  Location: [facility] ORS;  Service: ENT;  Laterality: N/A;   TONSILLECTOMY     WISDOM TOOTH EXTRACTION      OB History     Gravida  3   Para  2   Term  2   Preterm      AB  1   Living  2      SAB  1   IAB      Ectopic      Multiple      Live Births  2            Home Medications    Prior to Admission medications   Medication Sig Start Date End Date Taking? Authorizing Provider  amoxicillin -clavulanate (AUGMENTIN ) 875-125 MG tablet Take 1 tablet by mouth every 12 (twelve) hours. 03/23/24  Yes Corlis Burnard DEL, NP  predniSONE  (DELTASONE ) 10 MG tablet Take 4 tablets (40 mg total) by mouth daily for 5 days. 03/23/24 03/28/24 Yes Corlis Burnard DEL, NP  acetaminophen  (TYLENOL ) 500 MG tablet Take 1,000  mg by mouth 2 (two) times daily as needed for moderate pain or headache.    [provider]  albuterol  (VENTOLIN  HFA) 108 (90 Base) MCG/ACT inhaler Inhale 1-2 puffs into the lungs every 6 (six) hours as needed. 12/02/23   Vivienne Delon HERO, PA-C  fluticasone  (FLONASE ) 50 MCG/ACT nasal spray Place 2 sprays into both nostrils daily. 10/30/19   Hedges, Reyes, PA-C  fluticasone  (FLOVENT  HFA) 110 MCG/ACT inhaler Inhale 2 puffs into the lungs in the morning and at bedtime. 12/02/23   Burnette, Viveca M, PA-C  Ginger, Zingiber officinalis, (GINGER ROOT PO) Take by mouth.    [provider]  Ginkgo Biloba 40 MG TABS Take by mouth.    [provider]  ipratropium-albuterol  (DUONEB) 0.5-2.5 (3) MG/3ML SOLN Take 3 mLs by nebulization every 4 (four) hours as needed. 12/02/23   Vivienne Delon HERO, PA-C   levonorgestrel  (MIRENA ) 20 MCG/DAY IUD 1 each by Intrauterine route once for 1 dose. 04/01/22 12/06/28  Copland, Alicia B, PA-C  Menthol, Topical Analgesic, (ICY HOT EX) Apply 1 application topically daily as needed (pain).    [provider]  Multiple Vitamin (MULTI-VITAMINS) TABS Take 1 tablet by mouth every evening.     [provider]  Multiple Vitamins-Minerals (ZINC PO) Take by mouth.    [provider]  Omega-3 Fatty Acids (FISH OIL ULTRA) 1400 MG CAPS Take 1,400 mg by mouth every evening.    [provider]  pantoprazole  (PROTONIX ) 20 MG tablet Take 1 tablet (20 mg total) by mouth daily. 01/09/24 01/08/25  Kasa, Kurian, MD  PROTEIN PO Take by mouth.    [provider]  Wheat Dextrin (BENEFIBER PO) Take by mouth.    [provider]    Family History Family History  Problem Relation Age of Onset   Hypertension Mother    Hyperlipidemia Mother    Diabetes Mellitus II Mother    Kidney disease Mother    Hypothyroidism Mother    Sleep apnea Mother    Heart disease Father    CAD Father    Hyperlipidemia Father    Sleep apnea Father    Breast cancer Maternal Aunt 40   Diabetes Maternal Grandmother    Hypertension Maternal Grandmother    Heart disease Paternal Grandmother    Heart disease Paternal Grandfather    Breast cancer Cousin 35   Heart disease Other    Breast cancer Other    Breast cancer Other     Social History Social History   Tobacco Use   Smoking status: Never   Smokeless tobacco: Never  Vaping Use   Vaping status: Never Used  Substance Use Topics   Alcohol use: Yes    Alcohol/week: 3.0 - 4.0 standard drinks of alcohol    Types: 3 - 4 Glasses of wine per week    Comment: OCC   Drug use: No     Allergies   Versed [midazolam]   Review of Systems Review of Systems  Constitutional:  Negative for chills and fever.  HENT:  Positive for congestion, sinus pressure and sore throat. Negative for ear pain.    Respiratory:  Positive for cough. Negative for shortness of breath.      Physical Exam Triage Vital Signs ED Triage Vitals  Encounter Vitals Group     BP 03/23/24 1742 109/74     Systolic BP Percentile --      Diastolic BP Percentile --      Pulse Rate 03/23/24 1742 93  Resp 03/23/24 1742 18     Temp 03/23/24 1742 98.2 F (36.8 C)     Temp src --      SpO2 03/23/24 1742 97 %     Weight --      Height --      Head Circumference --      Peak Flow --      Pain Score 03/23/24 1746 2     Pain Loc --      Pain Education --      Exclude from Growth Chart --    No data found.  Updated Vital Signs BP 109/74   Pulse 93   Temp 98.2 F (36.8 C)   Resp 18   SpO2 97%   Visual Acuity Right Eye Distance:   Left Eye Distance:   Bilateral Distance:    Right Eye Near:   Left Eye Near:    Bilateral Near:     Physical Exam Constitutional:      General: She is not in acute distress. HENT:     Right Ear: Tympanic membrane normal.     Left Ear: Tympanic membrane normal.     Nose: Congestion and rhinorrhea present.     Mouth/Throat:     Mouth: Mucous membranes are moist.     Pharynx: Oropharynx is clear.  Cardiovascular:     Rate and Rhythm: Normal rate and regular rhythm.     Heart sounds: Normal heart sounds.  Pulmonary:     Effort: Pulmonary effort is normal. No respiratory distress.     Breath sounds: Normal breath sounds.     Comments: Frequent nonproductive cough. Neurological:     Mental Status: She is alert.      UC Treatments / Results  Labs (all labs ordered are listed, but only abnormal results are displayed) Labs Reviewed - No data to display  EKG   Radiology No results found.  Procedures Procedures (including critical care time)  Medications Ordered in UC Medications - No data to display  Initial Impression / Assessment and Plan / UC Course  I have reviewed the triage vital signs and the nursing notes.  Pertinent labs & imaging results  that were available during my care of the patient were reviewed by me and considered in my medical decision making (see chart for details).    Asthma exacerbation, acute sinusitis.  Febrile and vital signs are stable.  Lungs are clear at this time and O2 sat is 97% on room air.  Patient has a frequent nonproductive cough and has needed to use her albuterol  more often.  Treating today with prednisone  and Augmentin .  Instructed patient to continue using her albuterol  nebulizer or inhaler as directed.  Instructed her to follow-up with her PCP on Monday.  ED precautions given.  Education provided on asthma and sinus infection.  She agrees to plan of care.  Final Clinical Impressions(s) / UC Diagnoses   Final diagnoses:  Mild intermittent asthma with acute exacerbation  Acute recurrent maxillary sinusitis     Discharge Instructions      Follow up with your primary care provider on Monday.  Go to the emergency department if you have worsening symptoms.    Continue to use your albuterol  inhaler as directed.  Take the prednisone  and Augmentin  as directed.      ED Prescriptions     Medication Sig Dispense Auth. Provider   predniSONE  (DELTASONE ) 10 MG tablet Take 4 tablets (40 mg total) by mouth daily for  5 days. 20 tablet Corlis Burnard DEL, NP   amoxicillin -clavulanate (AUGMENTIN ) 875-125 MG tablet Take 1 tablet by mouth every 12 (twelve) hours. 14 tablet Corlis Burnard DEL, NP      PDMP not reviewed this encounter.   Corlis Burnard DEL, NP 03/23/24 TR[place]

## 2024-03-23 NOTE — ED Triage Notes (Addendum)
 Patient to Urgent Care with complaints of nasal congestion/ cough/ facial and sinus pressure/ eye drainage/ sore throat.  Reports symptoms since having the flu at the end of march (took zithromax), worse x1 week.  Meds: zyrtec/ Mucinex/ tylenol  cold and sinus/ cough drops/ various vitamins.

## 2024-03-23 NOTE — Discharge Instructions (Addendum)
 Follow up with your primary care provider on Monday.  Go to the emergency department if you have worsening symptoms.    Continue to use your albuterol  inhaler as directed.  Take the prednisone  and Augmentin  as directed.

## 2024-03-25 NOTE — Progress Notes (Signed)
 Cardiology Office Note  Date:  03/26/2024   ID:  Pamela Hamilton, DOB 08-06-1981, MRN [id number]  PCP:  Pamela Alt, MD   Chief Complaint  Patient presents with   New Patient (Initial Visit)    Ref by Dr. Branson Hamilton for family history of premature CAD. Patient c/o chest pain/discomfort & occasional shortness of breath with over exertion or with anxiety.     HPI:  [name] is a 43 year old woman with past medical history of Hyperlipidemia Family history of premature coronary disease Who presents by referral from Dr. 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  Significant family history of cardiac disease particularly on father and grandfather side History of coronary intervention Mother also with coronary disease hyperlipidemia  Denies chest pain concerning for angina Concerned about elevated cholesterol 260   EKG personally reviewed by myself on todays visit EKG Interpretation Date/Time:  Monday March 26 2024 16:04:47 EDT Ventricular Rate:  66 PR Interval:  136 QRS Duration:  100 QT Interval:  398 QTC Calculation: 417 R Axis:   6  Text Interpretation: Normal sinus rhythm normal EKG No previous ECGs available Confirmed by Pamela Hamilton [phone]) on 03/26/2024 4:10:47 PM    PMH:   has a past medical history of Abortion, missed, Allergy, Anemia, Anxiety, Arthritis, Asthma, BRCA negative (07/2022), Family history of breast cancer, GERD (gastroesophageal reflux disease), Headache, History of methicillin resistant staphylococcus aureus (MRSA) (02/2018), History of strep sore throat, Hyperlipidemia, and Hypothyroidism.  PSH:    Past Surgical History:  Procedure Laterality Date   BREAST CYST ASPIRATION Right 2008   BREAST CYST ASPIRATION Right 2009   COLONOSCOPY WITH  PROPOFOL  N/A 02/09/2024   Procedure: COLONOSCOPY WITH PROPOFOL ;  Surgeon: Luke Salaam, MD;  Location: [facility] ENDOSCOPY;  Service: Gastroenterology;  Laterality: N/A;   DILATION AND CURETTAGE OF UTERUS     EYE SURGERY     X 3   SHOULDER ARTHROSCOPY Right    TONSILLECTOMY N/A 07/13/2018   Procedure: TONSILLECTOMY;  Surgeon: Rogers Clayman, MD;  Location: [facility] ORS;  Service: ENT;  Laterality: N/A;   TONSILLECTOMY     WISDOM TOOTH EXTRACTION      Current Outpatient Medications  Medication Sig Dispense Refill   acetaminophen  (TYLENOL ) 500 MG tablet Take 1,000 mg by mouth 2 (two) times daily as needed for moderate pain or headache.     albuterol  (VENTOLIN  HFA) 108 (90 Base) MCG/ACT inhaler Inhale 1-2 puffs into the lungs every 6 (six) hours as needed. 6.7 g 0   amoxicillin -clavulanate (AUGMENTIN ) 875-125 MG tablet Take 1 tablet by mouth every 12 (twelve) hours. 14 tablet 0   fluticasone  (FLONASE ) 50 MCG/ACT nasal spray Place 2 sprays into both nostrils daily. 16 g 6   fluticasone  (FLOVENT  HFA) 110 MCG/ACT inhaler Inhale 2 puffs into the lungs in the morning and at bedtime. 12 g 1   Ginger, Zingiber officinalis, (GINGER ROOT PO) Take by mouth.     Ginkgo Biloba 40 MG TABS Take by mouth.     ipratropium-albuterol  (DUONEB) 0.5-2.5 (3) MG/3ML SOLN Take 3 mLs by nebulization every 4 (four) hours as needed. 360 mL 0   levonorgestrel  (MIRENA ) 20 MCG/DAY IUD 1 each by Intrauterine route once for 1 dose. 1 each 0   Menthol, Topical Analgesic, (ICY HOT EX) Apply 1 application topically daily as needed (  pain).     Multiple Vitamin (MULTI-VITAMINS) TABS Take 1 tablet by mouth every evening.      Multiple Vitamins-Minerals (ZINC PO) Take by mouth.     Omega-3 Fatty Acids (FISH OIL ULTRA) 1400 MG CAPS Take 1,400 mg by mouth every evening.     pantoprazole  (PROTONIX ) 20 MG tablet Take 1 tablet (20 mg total) by mouth daily. 30 tablet 1   predniSONE  (DELTASONE ) 10 MG tablet Take 4 tablets (40 mg total) by mouth  daily for 5 days. 20 tablet 0   PROTEIN PO Take by mouth.     Wheat Dextrin (BENEFIBER PO) Take by mouth.     No current facility-administered medications for this visit.    Allergies:   Versed [midazolam]   Social History:  The patient  reports that she has never smoked. She has never used smokeless tobacco. She reports current alcohol use of about 3.0 - 4.0 standard drinks of alcohol per week. She reports that she does not use drugs.   Family History:   family history includes Arrhythmia in her father; Atrial fibrillation in her father; Breast cancer in some other family members; Breast cancer (age of onset: 39) in her cousin; Breast cancer (age of onset: 58) in her maternal aunt; CAD in her father; Diabetes in her maternal grandmother; Diabetes Mellitus II in her mother; Heart attack (age of onset: 97) in her father; Heart disease in her father, mother, paternal grandfather, paternal grandmother, and another family member; Hyperlipidemia in her father and mother; Hypertension in her maternal grandmother and mother; Hypothyroidism in her mother; Kidney disease in her mother; Sleep apnea in her father and mother.    Review of Systems: Review of Systems  Constitutional: Negative.   HENT: Negative.    Respiratory: Negative.    Cardiovascular: Negative.   Gastrointestinal: Negative.   Musculoskeletal: Negative.   Neurological: Negative.   Psychiatric/Behavioral: Negative.    All other systems reviewed and are negative.   PHYSICAL EXAM: VS:  BP 118/80 (BP Location: Right Arm, Patient Position: Sitting, Cuff Size: Normal)   Pulse 66   Ht 5' 3.5" (1.613 m)   Wt 205 lb 6 oz (93.2 kg)   SpO2 98%   BMI 35.81 kg/m  , BMI Body mass index is 35.81 kg/m. GEN: Well nourished, well developed, in no acute distress HEENT: normal Neck: no JVD, carotid bruits, or masses Cardiac: RRR; no murmurs, rubs, or gallops,no edema  Respiratory:  clear to auscultation bilaterally, normal work of  breathing GI: soft, nontender, nondistended, + BS MS: no deformity or atrophy Skin: warm and dry, no rash Neuro:  Strength and sensation are intact Psych: euthymic mood, full affect  Recent Labs: 07/26/2023: TSH 6.540 11/08/2023: Hamilton 23; BUN 11; Creatinine, Ser 0.91; Hemoglobin 15.5; Platelets 290; Potassium 3.6; Sodium 135    Lipid Panel Lab Results  Component Value Date   CHOL 259 (H) 07/26/2023   HDL 42 07/26/2023   LDLCALC 181 (H) 07/26/2023   TRIG 194 (H) 07/26/2023      Wt Readings from Last 3 Encounters:  03/26/24 205 lb 6 oz (93.2 kg)  02/09/24 198 lb 9.6 oz (90.1 kg)  01/09/24 203 lb (92.1 kg)      ASSESSMENT AND PLAN:  Problem List Items Addressed This Visit       Cardiology Problems   Mixed hyperlipidemia     Other   Family history of premature coronary artery disease - Primary   Relevant Orders   EKG 12-Lead (Completed)  Hyperlipidemia Minimal aortic atherosclerosis on CT scan abdomen With that said, she would prefer aggressive management of hyperlipidemia Recommend she start Crestor  5 titrating up to 10 mg daily as tolerated Repeat lipid panel in 3 months time, if still elevated could potentially add Zetia or increase Crestor  dosing  Aortic atherosclerosis 2 small punctate areas of minimal aortic atherosclerosis noted on CT imaging distal aorta and bifurcation Reassurance provided Further risk stratification with CT calcium  scoring      Signed, Juanda Noon, M.D., Ph.D. [facility] [place], [place] [phone]

## 2024-03-26 ENCOUNTER — Encounter: Payer: Self-pay | Admitting: Cardiovascular Disease

## 2024-03-26 ENCOUNTER — Other Ambulatory Visit: Payer: Self-pay

## 2024-03-26 ENCOUNTER — Ambulatory Visit: Payer: 59 | Attending: Cardiovascular Disease | Admitting: Cardiovascular Disease

## 2024-03-26 VITALS — BP 118/80 | HR 66 | Ht 63.5 in | Wt 205.4 lb

## 2024-03-26 DIAGNOSIS — Z8249 Family history of ischemic heart disease and other diseases of the circulatory system: Secondary | ICD-10-CM

## 2024-03-26 DIAGNOSIS — E782 Mixed hyperlipidemia: Secondary | ICD-10-CM

## 2024-03-26 DIAGNOSIS — Z79899 Other long term (current) drug therapy: Secondary | ICD-10-CM

## 2024-03-26 MED ORDER — ROSUVASTATIN CALCIUM 10 MG PO TABS
10.0000 mg | ORAL_TABLET | Freq: Every day | ORAL | 3 refills | Status: AC
  Filled 2024-03-26: qty 90, 90d supply, fill #0
  Filled 2024-11-16: qty 69, 69d supply, fill #1
  Filled 2024-11-16: qty 90, 90d supply, fill #1
  Filled 2024-11-16: qty 21, 21d supply, fill #1
  Filled 2024-11-19: qty 90, 90d supply, fill #0
  Filled 2024-11-19: qty 90, 90d supply, fill #1
  Filled 2024-11-19: qty 90, 90d supply, fill #0

## 2024-03-26 NOTE — Patient Instructions (Addendum)
 Medication Instructions:   Crestor  10 mg daily  If you need a refill on your cardiac medications before your next appointment, please call your pharmacy.   Lab work: Your provider would like for you to return in 3 month to have the following labs drawn: Lipid Panel and LFTs.   [facility] [address] [facility] [address], [place] [address] You do not need an appointment.  They are open from 8 am- 4:30 pm.  Lunch from 1:00 pm- 2:00 pm You will need to be fasting.   Testing/Procedures: CT coronary calcium  score.   - $99 out of pocket cost at the time of your test - Call [phone] to schedule at your convenience.  [facility] [address] [place], [place] [address]   Coronary CalciumScan A coronary calcium  scan is an imaging test used to look for deposits of calcium  and other fatty materials (plaques) in the inner lining of the blood vessels of the heart (coronary arteries). These deposits of calcium  and plaques can partly clog and narrow the coronary arteries without producing any symptoms or warning signs. This puts a person at risk for a heart attack. This test can detect these deposits before symptoms develop. Tell a health care provider about: Any allergies you have. All medicines you are taking, including vitamins, herbs, eye drops, creams, and over-the-counter medicines. Any problems you or family members have had with anesthetic medicines. Any blood disorders you have. Any surgeries you have had. Any medical conditions you have. Whether you are pregnant or may be pregnant. What are the risks? Generally, this is a safe procedure. However, problems may occur, including: Harm to a pregnant woman and her unborn baby. This test involves the use of radiation. Radiation exposure can be dangerous to a pregnant woman and her unborn baby. If you are pregnant, you generally should not have this  procedure done. Slight increase in the risk of cancer. This is because of the radiation involved in the test. What happens before the procedure? No preparation is needed for this procedure. What happens during the procedure? You will undress and remove any jewelry around your neck or chest. You will put on a hospital gown. Sticky electrodes will be placed on your chest. The electrodes will be connected to an electrocardiogram (ECG) machine to record a tracing of the electrical activity of your heart. A CT scanner will take pictures of your heart. During this time, you will be asked to lie still and hold your breath for 2-3 seconds while a picture of your heart is being taken. The procedure may vary among health care providers and hospitals. What happens after the procedure? You can get dressed. You can return to your normal activities. It is up to you to get the results of your test. Ask your health care provider, or the department that is doing the test, when your results will be ready. Summary A coronary calcium  scan is an imaging test used to look for deposits of calcium  and other fatty materials (plaques) in the inner lining of the blood vessels of the heart (coronary arteries). Generally, this is a safe procedure. Tell your health care provider if you are pregnant or may be pregnant. No preparation is needed for this procedure. A CT scanner will take pictures of your heart. You can return to your normal activities after the scan is done. This information is not intended to replace advice given to you by your  health care provider. Make sure you discuss any questions you have with your health care provider. Document Released: 05/20/2008 Document Revised: 10/11/2016 Document Reviewed: 10/11/2016 Elsevier Interactive Patient Education  2017 [employer].   Follow-Up: At [facility], you and your health needs are our priority.  As part of our continuing mission to provide you with  exceptional heart care, we have created designated Provider Care Teams.  These Care Teams include your primary Cardiologist (physician) and Advanced Practice Providers (APPs -  Physician Assistants and Nurse Practitioners) who all work together to provide you with the care you need, when you need it.  You will need a follow up appointment in 12 months  Providers on your designated Care Team:   Laneta Pintos, NP Varney Gentleman, PA-C Cadence Gennaro Khat, [place]  COVID-19 Vaccine Information can be found at: [email or web address] For questions related to vaccine distribution or appointments, please email [email or web address] or call [phone].

## 2024-04-03 ENCOUNTER — Encounter: Payer: Self-pay | Admitting: Family Medicine

## 2024-04-03 ENCOUNTER — Ambulatory Visit (INDEPENDENT_AMBULATORY_CARE_PROVIDER_SITE_OTHER): Payer: Self-pay | Admitting: Family Medicine

## 2024-04-03 VITALS — BP 118/75 | HR 80 | Ht 63.0 in | Wt 203.0 lb

## 2024-04-03 DIAGNOSIS — Z1159 Encounter for screening for other viral diseases: Secondary | ICD-10-CM | POA: Diagnosis not present

## 2024-04-03 DIAGNOSIS — R053 Chronic cough: Secondary | ICD-10-CM | POA: Insufficient documentation

## 2024-04-03 DIAGNOSIS — E782 Mixed hyperlipidemia: Secondary | ICD-10-CM | POA: Diagnosis not present

## 2024-04-03 DIAGNOSIS — Z114 Encounter for screening for human immunodeficiency virus [HIV]: Secondary | ICD-10-CM | POA: Diagnosis not present

## 2024-04-03 MED ORDER — PANTOPRAZOLE SODIUM 40 MG PO TBEC
40.0000 mg | DELAYED_RELEASE_TABLET | Freq: Every day | ORAL | 3 refills | Status: AC
  Filled 2024-10-10: qty 90, 90d supply, fill #0

## 2024-04-03 MED ORDER — PROMETHAZINE-DM 6.25-15 MG/5ML PO SYRP: 5.0000 mL | ORAL_SOLUTION | Freq: Four times a day (QID) | ORAL | 1 refills | Status: DC | PRN

## 2024-04-03 NOTE — Progress Notes (Signed)
 Established visit    Patient: Pamela Hamilton   DOB: Nov 13, 1981   43 y.o. Female  MRN: [id number] Visit Date: 04/03/2024  Today's healthcare provider: Mimi Alt, MD   Chief Complaint  Patient presents with   Annual Exam    No main concerns    Subjective    Pamela Hamilton is a 43 y.o. female who presents today for a follow up of chronic conditions including a cough that has been persistent.    She does have additional problems to discuss today.   Discussed the use of AI scribe software for clinical note transcription with the patient, who gave verbal consent to proceed.  History of Present Illness Pamela Hamilton is a 43 year old female who presents for an annual physical exam.  She has a history of mixed hyperlipidemia and was recently started on Crestor  (rosuvastatin ) by her cardiologist.  She has been experiencing a persistent cough since December, which she attributes to a combination of flu in January, various viruses, and pollen allergies. She uses inhalers and DuoNebs prescribed by her pulmonologist, but finds the inhaler makes her jittery and is not always feasible to use at work. Her current medications include Flovent  twice daily, an albuterol  inhaler, and DuoNeb nebulizer. She also uses Flonase  and Zyrtec for allergy management. She has tried Tessalon  Perles without success and uses promethazine  cough syrup occasionally, which provides some relief. Recent courses of antibiotics and steroids have not significantly improved her cough.  She mentions mild sleep apnea diagnosed via a sleep study, but has not received follow-up on potential treatment.  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  She has been experiencing heartburn symptoms and was prescribed Protonix , which initially helped but has not been consistently effective since she became ill. She takes 20 mg daily but has missed doses.  She is trying to  manage her weight through diet and increased physical activity, but has not seen significant changes. She weighs over 200 pounds and is on her feet at work, but prefers not to use medication for weight management.     Past Medical History:  Diagnosis Date   Abortion, missed    Allergy    Anemia    Anxiety    Arthritis    Asthma    EXERCISE INDUCED   BRCA negative 07/2022   MyRisk neg except MSH3 VUS   Family history of breast cancer    genetic testing letter sent 3/19, 7/20; 8/23 IBIS=15.5%/riskscore=9.1%   GERD (gastroesophageal reflux disease)    OCC NO MEDS   Headache    MIGRAINES   History of methicillin resistant staphylococcus aureus (MRSA) 02/2018   NASAL SWAB    History of strep sore throat    Hyperlipidemia    Hypothyroidism    Past Surgical History:  Procedure Laterality Date   BREAST CYST ASPIRATION Right 2008   BREAST CYST ASPIRATION Right 2009   COLONOSCOPY WITH PROPOFOL  N/A 02/09/2024   Procedure: COLONOSCOPY WITH PROPOFOL ;  Surgeon: Luke Salaam, MD;  Location: [facility] ENDOSCOPY;  Service: Gastroenterology;  Laterality: N/A;   DILATION AND CURETTAGE OF UTERUS     EYE SURGERY     X 3   SHOULDER ARTHROSCOPY Right    TONSILLECTOMY N/A 07/13/2018   Procedure: TONSILLECTOMY;  Surgeon: Rogers Clayman, MD;  Location: [facility] ORS;  Service: ENT;  Laterality: N/A;   TONSILLECTOMY     WISDOM TOOTH EXTRACTION  Social History   Socioeconomic History   Marital status: Married    Spouse name: Not on file   Number of children: Not on file   Years of education: Not on file   Highest education level: Not on file  Occupational History   Not on file  Tobacco Use   Smoking status: Never   Smokeless tobacco: Never  Vaping Use   Vaping status: Never Used  Substance and Sexual Activity   Alcohol use: Yes    Alcohol/week: 3.0 - 4.0 standard drinks of alcohol    Types: 3 - 4 Glasses of wine per week    Comment: OCC   Drug use: No   Sexual activity: Yes    Birth  control/protection: I.U.D.    Comment: Mirena   Other Topics Concern   Not on file  Social History Narrative   Not on file   Social Drivers of Health   Financial Resource Strain: Low Risk  (04/03/2024)   Overall Financial Resource Strain (CARDIA)    Difficulty of Paying Living Expenses: Not hard at all  Food Insecurity: No Food Insecurity (04/03/2024)   Hunger Vital Sign    Worried About Running Out of Food in the Last Year: Never true    Ran Out of Food in the Last Year: Never true  Transportation Needs: No Transportation Needs (04/03/2024)   PRAPARE - Administrator, Civil Service (Medical): No    Lack of Transportation (Non-Medical): No  Physical Activity: Not on file  Stress: No Stress Concern Present (04/03/2024)   [employer] of Occupational Health - Occupational Stress Questionnaire    Feeling of Stress : Only a little  Social Connections: Not on file  Intimate Partner Violence: Not At Risk (04/03/2024)   Humiliation, Afraid, Rape, and Kick questionnaire    Fear of Current or Ex-Partner: No    Emotionally Abused: No    Physically Abused: No    Sexually Abused: No   Family Status  Relation Name Status   Mother  Alive   Father  Alive   Mat Aunt  Alive   MGM  Deceased   MGF  Deceased   PGM  Deceased   PGF  Deceased   Cousin pat Deceased   Other uncle (Not Specified)   Other pat grt aunt Deceased   Other pat grt aunt Deceased  No partnership data on file   Family History  Problem Relation Age of Onset   Heart disease Mother    Hypertension Mother    Hyperlipidemia Mother    Diabetes Mellitus II Mother    Kidney disease Mother    Hypothyroidism Mother    Sleep apnea Mother    Arrhythmia Father    Heart attack Father 59   Heart disease Father    CAD Father    Hyperlipidemia Father    Sleep apnea Father    Atrial fibrillation Father    Breast cancer Maternal Aunt 43   Diabetes Maternal Grandmother    Hypertension Maternal Grandmother     Heart disease Paternal Grandmother    Heart disease Paternal Grandfather    Breast cancer Cousin 35   Heart disease Other    Breast cancer Other    Breast cancer Other    Allergies  Allergen Reactions   Versed [Midazolam]     PT STATES SHE BECOMES VERY DISORIENTED AND LOOPY     Medications: Outpatient Medications Prior to Visit  Medication Sig   acetaminophen  (TYLENOL ) 500 MG tablet  Take 1,000 mg by mouth 2 (two) times daily as needed for moderate pain or headache.   albuterol  (VENTOLIN  HFA) 108 (90 Base) MCG/ACT inhaler Inhale 1-2 puffs into the lungs every 6 (six) hours as needed.   fluticasone  (FLONASE ) 50 MCG/ACT nasal spray Place 2 sprays into both nostrils daily.   fluticasone  (FLOVENT  HFA) 110 MCG/ACT inhaler Inhale 2 puffs into the lungs in the morning and at bedtime.   Ginger, Zingiber officinalis, (GINGER ROOT PO) Take by mouth.   Ginkgo Biloba 40 MG TABS Take by mouth.   ipratropium-albuterol  (DUONEB) 0.5-2.5 (3) MG/3ML SOLN Take 3 mLs by nebulization every 4 (four) hours as needed.   levonorgestrel  (MIRENA ) 20 MCG/DAY IUD 1 each by Intrauterine route once for 1 dose.   Menthol, Topical Analgesic, (ICY HOT EX) Apply 1 application topically daily as needed (pain).   Multiple Vitamin (MULTI-VITAMINS) TABS Take 1 tablet by mouth every evening.    Multiple Vitamins-Minerals (ZINC PO) Take by mouth.   Omega-3 Fatty Acids (FISH OIL ULTRA) 1400 MG CAPS Take 1,400 mg by mouth every evening.   pantoprazole  (PROTONIX ) 20 MG tablet Take 1 tablet (20 mg total) by mouth daily.   PROTEIN PO Take by mouth.   rosuvastatin  (CRESTOR ) 10 MG tablet Take 1 tablet (10 mg total) by mouth daily.   Wheat Dextrin (BENEFIBER PO) Take by mouth.   amoxicillin -clavulanate (AUGMENTIN ) 875-125 MG tablet Take 1 tablet by mouth every 12 (twelve) hours. (Patient not taking: Reported on 04/03/2024)   No facility-administered medications prior to visit.    Review of Systems  Last CBC Lab Results   Component Value Date   WBC 15.8 (H) 11/08/2023   HGB 15.5 (H) 11/08/2023   HCT 45.4 11/08/2023   MCV 92.5 11/08/2023   MCH 31.6 11/08/2023   RDW 13.0 11/08/2023   PLT 290 11/08/2023   Last metabolic panel Lab Results  Component Value Date   GLUCOSE 114 (H) 11/08/2023   NA 135 11/08/2023   K 3.6 11/08/2023   CL 101 11/08/2023   CO2 23 11/08/2023   BUN 11 11/08/2023   CREATININE 0.91 11/08/2023   GFRNONAA >60 11/08/2023   CALCIUM  9.0 11/08/2023   PROT 8.1 11/08/2023   ALBUMIN 4.6 11/08/2023   LABGLOB 2.1 07/26/2023   BILITOT 1.4 (H) 11/08/2023   ALKPHOS 72 11/08/2023   AST 20 11/08/2023   ALT 23 11/08/2023   ANIONGAP 11 11/08/2023   Last lipids Lab Results  Component Value Date   CHOL 259 (H) 07/26/2023   HDL 42 07/26/2023   LDLCALC 181 (H) 07/26/2023   TRIG 194 (H) 07/26/2023   CHOLHDL 6.2 (H) 07/26/2023   The 10-year ASCVD risk score (Arnett DK, et al., 2019) is: 1.5%  Last hemoglobin A1c Lab Results  Component Value Date   HGBA1C 5.5 07/26/2023   Last thyroid  functions Lab Results  Component Value Date   TSH 6.540 (H) 07/26/2023   Last vitamin D  Lab Results  Component Value Date   VD25OH 36.3 02/14/2018   Last vitamin B12 and Folate Lab Results  Component Value Date   VITAMINB12 597 02/14/2018     {See past labs  Heme  Chem  Endocrine  Serology  Results Review (optional):1}  Objective    BP 118/75   Pulse 80   Ht 5\' 3"  (1.6 m)   Wt 203 lb (92.1 kg)   SpO2 97%   BMI 35.96 kg/m  BP Readings from Last 3 Encounters:  04/03/24 118/75  03/26/24 118/80  03/23/24 109/74   Wt Readings from Last 3 Encounters:  04/03/24 203 lb (92.1 kg)  03/26/24 205 lb 6 oz (93.2 kg)  02/09/24 198 lb 9.6 oz (90.1 kg)    {See vitals history (optional):1}    Physical Exam  ***  Last depression screening scores    04/03/2024    3:33 PM 10/04/2023    3:40 PM  PHQ 2/9 Scores  PHQ - 2 Score 0 0  PHQ- 9 Score 3     Last fall risk screening     10/04/2023    3:40 PM  Fall Risk   Falls in the past year? 1  Number falls in past yr: 0  Injury with Fall? 1  Risk for fall due to : History of fall(s);No Fall Risks  Follow up Falls evaluation completed    Last Audit-C alcohol use screening    04/03/2024    3:32 PM  Alcohol Use Disorder Test (AUDIT)  1. How often do you have a drink containing alcohol? 1  2. How many drinks containing alcohol do you have on a typical day when you are drinking? 0  3. How often do you have six or more drinks on one occasion? 0  AUDIT-C Score 1   A score of 3 or more in women, and 4 or more in men indicates increased risk for alcohol abuse, EXCEPT if all of the points are from question 1   No results found for any visits on 04/03/24.  Assessment & Plan    Routine Health Maintenance and Physical Exam  Immunization History  Administered Date(s) Administered   Hepatitis B, ADULT 05/12/2015, 06/13/2015, 11/19/2015   Influenza,inj,Quad PF,6+ Mos 08/24/2017   Influenza-Unspecified 07/20/2021, 09/12/2023   PFIZER SARS-COV-2 Pediatric Vaccination 5-69yrs 12/04/2020, 07/03/2021   PFIZER(Purple Top)SARS-COV-2 Vaccination 01/02/2021   PPD Test 04/17/2015, 05/26/2015, 04/19/2016, 07/12/2020   Tdap 07/12/2020    Health Maintenance  Topic Date Due   HIV Screening  Never done   Hepatitis C Screening  Never done   Pneumococcal Vaccine 59-65 Years old (1 of 2 - PCV) Never done   COVID-19 Vaccine (2 - Pfizer risk series) 07/24/2021   INFLUENZA VACCINE  07/06/2024   Cervical Cancer Screening (HPV/Pap Cotest)  07/25/2028   DTaP/Tdap/Td (2 - Td or Tdap) 07/12/2030   HPV VACCINES  Aged Out   Meningococcal B Vaccine  Aged Out    Problem List Items Addressed This Visit   None   Assessment and Plan Assessment & Plan Wellness Visit Annual physical examination conducted. Emphasized the importance of regular exercise and a balanced diet. Recommended screenings and lab tests to monitor overall health. -  Recommend 150 minutes of exercise per week - Advise well-balanced diet - Order CBC, CMP, A1c, lipid panel, hepatitis and HIV screening  Mixed hyperlipidemia Mixed hyperlipidemia with a family history of coronary artery disease. Recently started on rosuvastatin  by cardiologist. Plan to titrate dose and recheck lipid levels in three months. Calcium  score planned to assess atherosclerosis due to findings on CT scan. - Continue rosuvastatin  5 mg, increase to 10 mg as per cardiologist's plan - Recheck lipid levels, LFTs, and LDL in three months - Plan for calcium  score to assess atherosclerosis  Gastroesophageal reflux disease (GERD) Intermittent heartburn symptoms. Currently on pantoprazole  20 mg daily. Symptoms not fully controlled, possibly due to inconsistent use during illness. Consider increasing dosage for better symptom management. - Increase pantoprazole  to 40 mg daily - Ensure consistent use of pantoprazole   Chronic cough Persistent  cough since December, exacerbated by flu and allergies. Currently using albuterol  inhaler, DuoNeb, fluticasone , and cetirizine. Cough may be related to allergies or GERD. Promethazine  cough syrup provides some relief. Consider alternative allergy medications and throat soothing measures. - Prescribe promethazine  cough syrup - Consider trial of fexofenadine instead of cetirizine - Advise use of desloratadine spray for throat irritation - Recommend warm tea with honey and ginger for throat soothing  Mild sleep apnea Mild sleep apnea diagnosed via sleep study. Awaiting follow-up from pulmonology for further management plan. - Contact pulmonology for follow-up on sleep study results and management plan  Diverticulitis Diverticulitis with recent colonoscopy in February showing normal results. Small amount of atherosclerosis noted on CT scan. No current gastrointestinal symptoms reported.       No follow-ups on file.       Mimi Alt,  MD  [facility] [phone] (phone) [phone] (fax)  [facility]

## 2024-06-18 ENCOUNTER — Other Ambulatory Visit: Payer: Self-pay

## 2024-06-18 DIAGNOSIS — Z6835 Body mass index (BMI) 35.0-35.9, adult: Secondary | ICD-10-CM | POA: Diagnosis not present

## 2024-06-18 DIAGNOSIS — J4520 Mild intermittent asthma, uncomplicated: Secondary | ICD-10-CM | POA: Diagnosis not present

## 2024-06-18 DIAGNOSIS — Z79899 Other long term (current) drug therapy: Secondary | ICD-10-CM | POA: Diagnosis not present

## 2024-06-18 DIAGNOSIS — H524 Presbyopia: Secondary | ICD-10-CM | POA: Diagnosis not present

## 2024-06-18 DIAGNOSIS — H18452 Nodular corneal degeneration, left eye: Secondary | ICD-10-CM | POA: Diagnosis not present

## 2024-06-18 DIAGNOSIS — H11002 Unspecified pterygium of left eye: Secondary | ICD-10-CM | POA: Diagnosis not present

## 2024-06-18 DIAGNOSIS — E66812 Obesity, class 2: Secondary | ICD-10-CM | POA: Diagnosis not present

## 2024-06-18 DIAGNOSIS — G4730 Sleep apnea, unspecified: Secondary | ICD-10-CM | POA: Diagnosis not present

## 2024-06-18 MED ORDER — OFLOXACIN 0.3 % OP SOLN
1.0000 [drp] | Freq: Three times a day (TID) | OPHTHALMIC | 0 refills | Status: DC
Start: 2024-06-18 — End: 2024-10-18
  Filled 2024-06-18: qty 5, 10d supply, fill #0

## 2024-06-18 MED ORDER — PREDNISOLONE ACETATE 1 % OP SUSP
1.0000 [drp] | Freq: Four times a day (QID) | OPHTHALMIC | 0 refills | Status: DC
Start: 2024-06-18 — End: 2024-10-18
  Filled 2024-06-18: qty 5, 25d supply, fill #0

## 2024-06-18 MED ORDER — NEOMYCIN-POLYMYXIN-DEXAMETH 3.5-10000-0.1 OP SUSP
OPHTHALMIC | 0 refills | Status: DC
  Filled 2024-06-18: qty 5, 10d supply, fill #0

## 2024-06-18 MED ORDER — PREDNISOLONE ACETATE 1 % OP SUSP
OPHTHALMIC | 0 refills | Status: DC
  Filled 2024-06-18: qty 5, 10d supply, fill #0

## 2024-06-18 MED ORDER — OFLOXACIN 0.3 % OP SOLN
OPHTHALMIC | 0 refills | Status: DC
  Filled 2024-06-18: qty 5, 10d supply, fill #0

## 2024-06-18 MED ORDER — NEOMYCIN-POLYMYXIN-DEXAMETH 3.5-10000-0.1 OP OINT
TOPICAL_OINTMENT | Freq: Every day | OPHTHALMIC | 0 refills | Status: DC
Start: 2024-06-18 — End: 2024-10-18
  Filled 2024-06-18: qty 3.5, 10d supply, fill #0

## 2024-07-05 ENCOUNTER — Other Ambulatory Visit: Payer: Self-pay

## 2024-07-06 ENCOUNTER — Encounter: Payer: Self-pay | Admitting: Emergency Medicine

## 2024-07-06 ENCOUNTER — Other Ambulatory Visit: Payer: Self-pay

## 2024-07-09 ENCOUNTER — Other Ambulatory Visit: Payer: Self-pay

## 2024-07-10 ENCOUNTER — Other Ambulatory Visit: Payer: Self-pay

## 2024-07-10 MED ORDER — PREDNISOLONE ACETATE 1 % OP SUSP
Freq: Three times a day (TID) | OPHTHALMIC | 0 refills | Status: DC
  Filled 2024-07-10: qty 10, 25d supply, fill #0

## 2024-07-12 ENCOUNTER — Other Ambulatory Visit: Payer: Self-pay

## 2024-08-07 ENCOUNTER — Other Ambulatory Visit: Payer: Self-pay | Admitting: Obstetrics & Gynecology

## 2024-08-07 DIAGNOSIS — Z1231 Encounter for screening mammogram for malignant neoplasm of breast: Secondary | ICD-10-CM

## 2024-08-09 ENCOUNTER — Ambulatory Visit: Admitting: Family Medicine

## 2024-08-09 VITALS — BP 114/79 | HR 70 | Resp 16 | Ht 64.0 in | Wt 201.7 lb

## 2024-08-09 DIAGNOSIS — Z130 Encounter for screening for diseases of the blood and blood-forming organs and certain disorders involving the immune mechanism: Secondary | ICD-10-CM | POA: Diagnosis not present

## 2024-08-09 DIAGNOSIS — Z0001 Encounter for general adult medical examination with abnormal findings: Secondary | ICD-10-CM | POA: Diagnosis not present

## 2024-08-09 DIAGNOSIS — E669 Obesity, unspecified: Secondary | ICD-10-CM | POA: Diagnosis not present

## 2024-08-09 DIAGNOSIS — R7989 Other specified abnormal findings of blood chemistry: Secondary | ICD-10-CM | POA: Diagnosis not present

## 2024-08-09 DIAGNOSIS — Z23 Encounter for immunization: Secondary | ICD-10-CM | POA: Diagnosis not present

## 2024-08-09 DIAGNOSIS — E782 Mixed hyperlipidemia: Secondary | ICD-10-CM

## 2024-08-09 DIAGNOSIS — Z131 Encounter for screening for diabetes mellitus: Secondary | ICD-10-CM

## 2024-08-09 DIAGNOSIS — Z0000 Encounter for general adult medical examination without abnormal findings: Secondary | ICD-10-CM | POA: Insufficient documentation

## 2024-08-09 NOTE — Progress Notes (Signed)
 Complete physical exam   Patient: Pamela Hamilton   DOB: 20-Mar-1981   43 y.o. Female  MRN: [id number] Visit Date: 08/09/2024  Today's healthcare provider: Rockie Agent, MD   Chief Complaint  Patient presents with   Annual Exam    Patient reports doing well. She had eye surgery in July. She has started cholesterol medication Crestor . She is exercising. She is sleeping fairly well.    Subjective    Pamela Hamilton is a 43 y.o. female who presents today for a complete physical exam.    She does not have additional problems to discuss today.   Discussed the use of AI scribe software for clinical note transcription with the patient, who gave verbal consent to proceed.  History of Present Illness Pamela Hamilton is a 43 year old female who presents for her annual physical exam.  She has a history of hyperlipidemia with a current LDL of 181 mg/dL and total cholesterol of 259 mg/dL. Her last A1c was 5.5%. She is not fasting today as she had a hearty soup for lunch.  She has a history of thyroid  issues, which are being monitored.  She has chronic lung issues, including bronchitis flares, and has asthma.  She is considering weight loss options and mentions being 'stuck in the two hundred, a little less than two hundred range' despite efforts to increase physical activity and adjust her diet. She is interested in exploring medication options that might also address her attention issues, as she feels she is on the 'low specter of the ADD spec scale'.  She has two daughters, aged 9 and 37, and her daily routine includes picking them up from school, which impacts her schedule. She works from 7 AM to 3:30 PM.     Past Medical History:  Diagnosis Date   Abortion, missed    Allergy    Anemia    Anxiety    Arthritis    Asthma    EXERCISE INDUCED   BRCA negative 07/2022   MyRisk neg except MSH3 VUS   Family history of breast cancer    genetic testing  letter sent 3/19, 7/20; 8/23 IBIS=15.5%/riskscore=9.1%   GERD (gastroesophageal reflux disease)    OCC NO MEDS   Headache    MIGRAINES   History of methicillin resistant staphylococcus aureus (MRSA) 02/2018   NASAL SWAB    History of strep sore throat    Hyperlipidemia    Hypothyroidism    Past Surgical History:  Procedure Laterality Date   BREAST CYST ASPIRATION Right 2008   BREAST CYST ASPIRATION Right 2009   COLONOSCOPY WITH PROPOFOL  N/A 02/09/2024   Procedure: COLONOSCOPY WITH PROPOFOL ;  Surgeon: Therisa Bi, MD;  Location: [facility] ENDOSCOPY;  Service: Gastroenterology;  Laterality: N/A;   DILATION AND CURETTAGE OF UTERUS     EYE SURGERY     X 3   SHOULDER ARTHROSCOPY Right    TONSILLECTOMY N/A 07/13/2018   Procedure: TONSILLECTOMY;  Surgeon: Milissa Hamming, MD;  Location: [facility] ORS;  Service: ENT;  Laterality: N/A;   TONSILLECTOMY     WISDOM TOOTH EXTRACTION     Social History   Socioeconomic History   Marital status: Married    Spouse name: Not on file   Number of children: Not on file   Years of education: Not on file   Highest education level: Bachelor's degree (e.g., BA, AB, BS)  Occupational History   Not on file  Tobacco Use   Smoking status: Never  Smokeless tobacco: Never  Vaping Use   Vaping status: Never Used  Substance and Sexual Activity   Alcohol use: Yes    Alcohol/week: 3.0 - 4.0 standard drinks of alcohol    Types: 3 - 4 Glasses of wine per week    Comment: OCC   Drug use: No   Sexual activity: Yes    Birth control/protection: I.U.D.    Comment: Mirena   Other Topics Concern   Not on file  Social History Narrative   Not on file   Social Drivers of Health   Financial Resource Strain: Low Risk  (08/09/2024)   Overall Financial Resource Strain (CARDIA)    Difficulty of Paying Living Expenses: Not hard at all  Food Insecurity: No Food Insecurity (08/09/2024)   Hunger Vital Sign    Worried About Running Out of Food in the Last Year: Never true     Ran Out of Food in the Last Year: Never true  Transportation Needs: No Transportation Needs (08/09/2024)   PRAPARE - Administrator, Civil Service (Medical): No    Lack of Transportation (Non-Medical): No  Physical Activity: Sufficiently Active (08/09/2024)   Exercise Vital Sign    Days of Exercise per Week: 5 days    Minutes of Exercise per Session: 30 min  Stress: No Stress Concern Present (08/09/2024)   [employer] of Occupational Health - Occupational Stress Questionnaire    Feeling of Stress: Only a little  Social Connections: Moderately Integrated (08/09/2024)   Social Connection and Isolation Panel    Frequency of Communication with Friends and Family: More than three times a week    Frequency of Social Gatherings with Friends and Family: Twice a week    Attends Religious Services: 1 to 4 times per year    Active Member of [employer] or Organizations: No    Attends Engineer, structural: Not on file    Marital Status: Married  Catering manager Violence: Not At Risk (04/03/2024)   Humiliation, Afraid, Rape, and Kick questionnaire    Fear of Current or Ex-Partner: No    Emotionally Abused: No    Physically Abused: No    Sexually Abused: No   Family Status  Relation Name Status   Mother  Alive   Father  Alive   Mat Aunt  Alive   MGM  Deceased   MGF  Deceased   PGM  Deceased   PGF  Deceased   Cousin pat Deceased   Other uncle (Not Specified)   Other pat grt aunt Deceased   Other pat grt aunt Deceased  No partnership data on file   Family History  Problem Relation Age of Onset   Heart disease Mother    Hypertension Mother    Hyperlipidemia Mother    Diabetes Mellitus II Mother    Kidney disease Mother    Hypothyroidism Mother    Sleep apnea Mother    Arrhythmia Father    Heart attack Father 33   Heart disease Father    CAD Father    Hyperlipidemia Father    Sleep apnea Father    Atrial fibrillation Father    Breast cancer Maternal Aunt 43    Diabetes Maternal Grandmother    Hypertension Maternal Grandmother    Heart disease Paternal Grandmother    Heart disease Paternal Grandfather    Breast cancer Cousin 35   Heart disease Other    Breast cancer Other    Breast cancer Other    Allergies  Allergen Reactions   Versed [Midazolam]     PT STATES SHE BECOMES VERY DISORIENTED AND LOOPY     Medications: Outpatient Medications Prior to Visit  Medication Sig   acetaminophen  (TYLENOL ) 500 MG tablet Take 1,000 mg by mouth 2 (two) times daily as needed for moderate pain or headache.   albuterol  (VENTOLIN  HFA) 108 (90 Base) MCG/ACT inhaler Inhale 1-2 puffs into the lungs every 6 (six) hours as needed.   fluticasone  (FLONASE ) 50 MCG/ACT nasal spray Place 2 sprays into both nostrils daily.   fluticasone  (FLOVENT  HFA) 110 MCG/ACT inhaler Inhale 2 puffs into the lungs in the morning and at bedtime.   Ginger, Zingiber officinalis, (GINGER ROOT PO) Take by mouth.   Ginkgo Biloba 40 MG TABS Take by mouth.   ipratropium-albuterol  (DUONEB) 0.5-2.5 (3) MG/3ML SOLN Take 3 mLs by nebulization every 4 (four) hours as needed.   levonorgestrel  (MIRENA ) 20 MCG/DAY IUD 1 each by Intrauterine route once for 1 dose.   Menthol, Topical Analgesic, (ICY HOT EX) Apply 1 application topically daily as needed (pain).   Multiple Vitamin (MULTI-VITAMINS) TABS Take 1 tablet by mouth every evening.    Multiple Vitamins-Minerals (ZINC PO) Take by mouth.   Omega-3 Fatty Acids (FISH OIL ULTRA) 1400 MG CAPS Take 1,400 mg by mouth every evening.   pantoprazole  (PROTONIX ) 40 MG tablet Take 1 tablet (40 mg total) by mouth daily.   promethazine -dextromethorphan (PROMETHAZINE -DM) 6.25-15 MG/5ML syrup Take 5 mLs by mouth 4 (four) times daily as needed for cough.   PROTEIN PO Take by mouth.   rosuvastatin  (CRESTOR ) 10 MG tablet Take 1 tablet (10 mg total) by mouth daily.   Wheat Dextrin (BENEFIBER PO) Take by mouth.   amoxicillin -clavulanate (AUGMENTIN ) 875-125 MG tablet  Take 1 tablet by mouth every 12 (twelve) hours. (Patient not taking: Reported on 04/03/2024)   neomycin -polymyxin b-dexamethasone  (MAXITROL ) 3.5-10000-0.1 OINT Place small ribbon into the left eye at bedtime for 10 nights.   ofloxacin  (OCUFLOX ) 0.3 % ophthalmic solution Place 1 drop into the left eye 3 (three) times daily for 10 days   prednisoLONE  acetate (PRED FORTE ) 1 % ophthalmic suspension Place 1 drop into the left eye 4 (four) times daily for 10 days   prednisoLONE  acetate (PRED FORTE ) 1 % ophthalmic suspension Place 1 drop into the left eye 3 (three) times daily for 7 days, THEN 1 drop 2 (two) times daily for 14 days, THEN 1 drop once daily for 14 days.   No facility-administered medications prior to visit.    Review of Systems  Last CBC Lab Results  Component Value Date   WBC 7.5 08/09/2024   HGB 13.8 08/09/2024   HCT 41.3 08/09/2024   MCV 95 08/09/2024   MCH 31.6 08/09/2024   RDW 12.8 08/09/2024   PLT 264 08/09/2024   Last metabolic panel Lab Results  Component Value Date   GLUCOSE 85 08/09/2024   NA 139 08/09/2024   K 4.4 08/09/2024   CL 102 08/09/2024   CO2 23 08/09/2024   BUN 14 08/09/2024   CREATININE 1.04 (H) 08/09/2024   GFRNONAA >60 11/08/2023   CALCIUM  9.2 08/09/2024   PROT 6.7 08/09/2024   ALBUMIN 4.4 08/09/2024   LABGLOB 2.3 08/09/2024   BILITOT 1.0 08/09/2024   ALKPHOS 68 08/09/2024   AST 16 08/09/2024   ALT 17 08/09/2024   ANIONGAP 11 11/08/2023   Last lipids Lab Results  Component Value Date   CHOL 168 08/09/2024   HDL 40 08/09/2024   LDLCALC  108 (H) 08/09/2024   TRIG 112 08/09/2024   CHOLHDL 6.2 (H) 07/26/2023   Last hemoglobin A1c Lab Results  Component Value Date   HGBA1C 5.3 08/09/2024   Last thyroid  functions Lab Results  Component Value Date   TSH 4.480 08/09/2024   Last vitamin D  Lab Results  Component Value Date   VD25OH 36.3 02/14/2018   Last vitamin B12 and Folate Lab Results  Component Value Date   VITAMINB12 597  02/14/2018     Objective    BP 114/79 (BP Location: Right Arm, Patient Position: Sitting, Cuff Size: Normal)   Pulse 70   Resp 16   Ht 5' 4 (1.626 m)   Wt 201 lb 11.2 oz (91.5 kg)   SpO2 97%   BMI 34.62 kg/m  BP Readings from Last 3 Encounters:  08/09/24 114/79  04/03/24 118/75  03/26/24 118/80   Wt Readings from Last 3 Encounters:  08/09/24 201 lb 11.2 oz (91.5 kg)  04/03/24 203 lb (92.1 kg)  03/26/24 205 lb 6 oz (93.2 kg)        Physical Exam Vitals reviewed.  Constitutional:      General: She is not in acute distress.    Appearance: Normal appearance. She is not ill-appearing, toxic-appearing or diaphoretic.  HENT:     Head: Normocephalic and atraumatic.     Right Ear: Tympanic membrane and external ear normal. There is no impacted cerumen.     Left Ear: Tympanic membrane and external ear normal. There is no impacted cerumen.     Nose: Nose normal.     Mouth/Throat:     Pharynx: Oropharynx is clear.  Eyes:     General: No scleral icterus.    Extraocular Movements: Extraocular movements intact.     Conjunctiva/sclera: Conjunctivae normal.     Pupils: Pupils are equal, round, and reactive to light.  Cardiovascular:     Rate and Rhythm: Normal rate and regular rhythm.     Pulses: Normal pulses.     Heart sounds: Normal heart sounds. No murmur heard.    No friction rub. No gallop.  Pulmonary:     Effort: Pulmonary effort is normal. No respiratory distress.     Breath sounds: Normal breath sounds. No wheezing, rhonchi or rales.  Abdominal:     General: Bowel sounds are normal. There is no distension.     Palpations: Abdomen is soft. There is no mass.     Tenderness: There is no abdominal tenderness. There is no guarding.  Musculoskeletal:        General: No deformity.     Cervical back: Normal range of motion and neck supple.     Right lower leg: No edema.     Left lower leg: No edema.  Lymphadenopathy:     Cervical: No cervical adenopathy.  Skin:     General: Skin is warm.     Capillary Refill: Capillary refill takes less than 2 seconds.     Findings: No erythema or rash.  Neurological:     General: No focal deficit present.     Mental Status: She is alert and oriented to person, place, and time.     Cranial Nerves: Cranial nerves 2-12 are intact. No cranial nerve deficit or facial asymmetry.     Motor: Motor function is intact. No weakness.     Gait: Gait normal.  Psychiatric:        Mood and Affect: Mood normal.        Behavior:  Behavior normal.       Last depression screening scores    04/03/2024    3:33 PM 10/04/2023    3:40 PM  PHQ 2/9 Scores  PHQ - 2 Score 0 0  PHQ- 9 Score 3     Last fall risk screening    10/04/2023    3:40 PM  Fall Risk   Falls in the past year? 1  Number falls in past yr: 0  Injury with Fall? 1  Risk for fall due to : History of fall(s);No Fall Risks  Follow up Falls evaluation completed    Last Audit-C alcohol use screening    08/09/2024    1:02 PM  Alcohol Use Disorder Test (AUDIT)  1. How often do you have a drink containing alcohol? 3  2. How many drinks containing alcohol do you have on a typical day when you are drinking? 0  3. How often do you have six or more drinks on one occasion? 0  AUDIT-C Score 3   4. How often during the last year have you found that you were not able to stop drinking once you had started? 0  5. How often during the last year have you failed to do what was normally expected from you because of drinking? 0  6. How often during the last year have you needed a first drink in the morning to get yourself going after a heavy drinking session? 0  7. How often during the last year have you had a feeling of guilt of remorse after drinking? 0  8. How often during the last year have you been unable to remember what happened the night before because you had been drinking? 0  9. Have you or someone else been injured as a result of your drinking? 0  10. Has a relative  or friend or a doctor or another health worker been concerned about your drinking or suggested you cut down? 0  Alcohol Use Disorder Identification Test Final Score (AUDIT) 3      Patient-reported   A score of 3 or more in women, and 4 or more in men indicates increased risk for alcohol abuse, EXCEPT if all of the points are from question 1   Results for orders placed or performed in visit on 08/09/24  Lipid Panel With LDL/HDL Ratio  Result Value Ref Range   Cholesterol, Total 168 100 - 199 mg/dL   Triglycerides 887 0 - 149 mg/dL   HDL 40 >60 mg/dL   VLDL Cholesterol Cal 20 5 - 40 mg/dL   LDL Chol Calc (NIH) 891 (H) 0 - 99 mg/dL   LDL/HDL Ratio 2.7 0.0 - 3.2 ratio  Comprehensive metabolic panel with GFR  Result Value Ref Range   Glucose 85 70 - 99 mg/dL   BUN 14 6 - 24 mg/dL   Creatinine, Ser 8.95 (H) 0.57 - 1.00 mg/dL   eGFR 68 >40 fO/fpw/8.26   BUN/Creatinine Ratio 13 9 - 23   Sodium 139 134 - 144 mmol/L   Potassium 4.4 3.5 - 5.2 mmol/L   Chloride 102 96 - 106 mmol/L   CO2 23 20 - 29 mmol/L   Calcium  9.2 8.7 - 10.2 mg/dL   Total Protein 6.7 6.0 - 8.5 g/dL   Albumin 4.4 3.9 - 4.9 g/dL   Globulin, Total 2.3 1.5 - 4.5 g/dL   Bilirubin Total 1.0 0.0 - 1.2 mg/dL   Alkaline Phosphatase 68 44 - 121 IU/L   AST 16  0 - 40 IU/L   ALT 17 0 - 32 IU/L  TSH + free T4  Result Value Ref Range   TSH 4.480 0.450 - 4.500 uIU/mL   Free T4 0.88 0.82 - 1.77 ng/dL  CBC  Result Value Ref Range   WBC 7.5 3.4 - 10.8 x10E3/uL   RBC 4.37 3.77 - 5.28 x10E6/uL   Hemoglobin 13.8 11.1 - 15.9 g/dL   Hematocrit 58.6 65.9 - 46.6 %   MCV 95 79 - 97 fL   MCH 31.6 26.6 - 33.0 pg   MCHC 33.4 31.5 - 35.7 g/dL   RDW 87.1 88.2 - 84.5 %   Platelets 264 150 - 450 x10E3/uL  Hemoglobin A1c  Result Value Ref Range   Hgb A1c MFr Bld 5.3 4.8 - 5.6 %   Est. average glucose Bld gHb Est-mCnc 105 mg/dL    Assessment & Plan    Routine Health Maintenance and Physical Exam  Immunization History  Administered  Date(s) Administered   Hepatitis B, ADULT 05/12/2015, 06/13/2015, 11/19/2015   Influenza, Seasonal, Injecte, Preservative Fre 08/09/2024   Influenza,inj,Quad PF,6+ Mos 08/24/2017   Influenza-Unspecified 07/20/2021, 09/12/2023   PFIZER SARS-COV-2 Pediatric Vaccination 5-68yrs 12/04/2020, 07/03/2021   PFIZER(Purple Top)SARS-COV-2 Vaccination 01/02/2021   PNEUMOCOCCAL CONJUGATE-20 08/09/2024   PPD Test 04/17/2015, 05/26/2015, 04/19/2016, 07/12/2020   Tdap 07/12/2020    Health Maintenance  Topic Date Due   HPV VACCINES (1 - 3-dose SCDM series) Never done   COVID-19 Vaccine (5 - 2025-26 season) 08/25/2024 (Originally 08/06/2024)   Hepatitis C Screening  11/08/2024 (Originally 03/14/1999)   HIV Screening  11/08/2024 (Originally 03/13/1996)   Cervical Cancer Screening (HPV/Pap Cotest)  07/25/2028   DTaP/Tdap/Td (2 - Td or Tdap) 07/12/2030   Pneumococcal Vaccine  Completed   Influenza Vaccine  Completed   Hepatitis B Vaccines 19-59 Average Risk  Completed   Meningococcal B Vaccine  Aged Out    Problem List Items Addressed This Visit     Abnormal thyroid  blood test   Relevant Orders   TSH + free T4 (Completed)   Annual physical exam - Primary   Mixed hyperlipidemia   Relevant Orders   Lipid Panel With LDL/HDL Ratio (Completed)   Obesity (BMI 30-39.9)   Relevant Orders   Comprehensive metabolic panel with GFR (Completed)   Other Visit Diagnoses       Immunization due       Relevant Orders   Flu vaccine trivalent PF, 6mos and older(Flulaval,Afluria,Fluarix,Fluzone) (Completed)   Pneumococcal conjugate vaccine 20-valent (Prevnar 20) (Completed)     Screening for diabetes mellitus       Relevant Orders   Hemoglobin A1c (Completed)     Screening for deficiency anemia       Relevant Orders   CBC (Completed)       Assessment and Plan Assessment & Plan Adult Wellness Visit Annual physical examination conducted. Declined hepatitis C and HIV screening at this time. - Administered  pneumococcal and flu vaccines today - Discuss HPV and COVID-19 vaccinations  Mixed hyperlipidemia Mixed hyperlipidemia with an LDL of 181 and total cholesterol of 259. Previous A1c was 5.5. Plan to recheck cholesterol levels. She will return for fasting labs due to recent meal. - Order lipid panel, CBC, complete metabolic panel, thyroid  function tests (TSH, T4), and A1c  Obesity Obesity with difficulty losing weight despite lifestyle modifications including walking 7500 steps a day and 20-minute workouts. Discussed potential pharmacological options for weight loss, including non-injection medications that may also address attention issues.  She is interested in exploring medication options that may assist with both weight loss and attention. - Schedule dedicated visit to discuss weight loss options in early November  Asthma Asthma with chronic lung issues. Discussed the importance of pneumococcal vaccination for protection against respiratory infections. She agreed to receive the pneumococcal vaccine due to her asthma and occupational requirements. - Administered pneumococcal vaccine       Return in about 2 months (around 10/09/2024) for attention, weight loss med .       Rockie Agent, MD  [facility] [phone] (phone) [phone] (fax)  [facility]

## 2024-08-10 LAB — LIPID PANEL WITH LDL/HDL RATIO
Cholesterol, Total: 168 mg/dL (ref 100–199)
HDL: 40 mg/dL (ref >39)
LDL Chol Calc (NIH): 108 mg/dL — ABNORMAL HIGH (ref 0–99)
LDL/HDL Ratio: 2.7 ratio (ref 0.0–3.2)
Triglycerides: 112 mg/dL (ref 0–149)
VLDL Cholesterol Cal: 20 mg/dL (ref 5–40)

## 2024-08-10 LAB — CBC
Hematocrit: 41.3 % (ref 34.0–46.6)
Hemoglobin: 13.8 g/dL (ref 11.1–15.9)
MCH: 31.6 pg (ref 26.6–33.0)
MCHC: 33.4 g/dL (ref 31.5–35.7)
MCV: 95 fL (ref 79–97)
Platelets: 264 x10E3/uL (ref 150–450)
RBC: 4.37 x10E6/uL (ref 3.77–5.28)
RDW: 12.8 % (ref 11.7–15.4)
WBC: 7.5 x10E3/uL (ref 3.4–10.8)

## 2024-08-10 LAB — COMPREHENSIVE METABOLIC PANEL WITH GFR
ALT: 17 IU/L (ref 0–32)
AST: 16 IU/L (ref 0–40)
Albumin: 4.4 g/dL (ref 3.9–4.9)
Alkaline Phosphatase: 68 IU/L (ref 44–121)
BUN/Creatinine Ratio: 13 (ref 9–23)
BUN: 14 mg/dL (ref 6–24)
Bilirubin Total: 1 mg/dL (ref 0.0–1.2)
CO2: 23 mmol/L (ref 20–29)
Calcium: 9.2 mg/dL (ref 8.7–10.2)
Chloride: 102 mmol/L (ref 96–106)
Creatinine, Ser: 1.04 mg/dL — ABNORMAL HIGH (ref 0.57–1.00)
Globulin, Total: 2.3 g/dL (ref 1.5–4.5)
Glucose: 85 mg/dL (ref 70–99)
Potassium: 4.4 mmol/L (ref 3.5–5.2)
Sodium: 139 mmol/L (ref 134–144)
Total Protein: 6.7 g/dL (ref 6.0–8.5)
eGFR: 68 mL/min/1.73 (ref >59)

## 2024-08-10 LAB — TSH+FREE T4
Free T4: 0.88 ng/dL (ref 0.82–1.77)
TSH: 4.48 u[IU]/mL (ref 0.450–4.500)

## 2024-08-10 LAB — HEMOGLOBIN A1C
Est. average glucose Bld gHb Est-mCnc: 105 mg/dL
Hgb A1c MFr Bld: 5.3 % (ref 4.8–5.6)

## 2024-08-14 ENCOUNTER — Ambulatory Visit: Payer: Self-pay | Admitting: Family Medicine

## 2024-08-30 ENCOUNTER — Ambulatory Visit
Admission: RE | Admit: 2024-08-30 | Discharge: 2024-08-30 | Disposition: A | Discharge status: Home / Self Care | Source: Ambulatory Visit | Attending: Obstetrics & Gynecology | Admitting: Obstetrics & Gynecology

## 2024-08-30 ENCOUNTER — Other Ambulatory Visit: Payer: Self-pay | Admitting: Obstetrics & Gynecology

## 2024-08-30 DIAGNOSIS — R928 Other abnormal and inconclusive findings on diagnostic imaging of breast: Secondary | ICD-10-CM

## 2024-08-30 DIAGNOSIS — Z1231 Encounter for screening mammogram for malignant neoplasm of breast: Secondary | ICD-10-CM | POA: Insufficient documentation

## 2024-08-30 DIAGNOSIS — N6322 Unspecified lump in the left breast, upper inner quadrant: Secondary | ICD-10-CM

## 2024-08-31 ENCOUNTER — Encounter: Payer: Self-pay | Admitting: Obstetrics & Gynecology

## 2024-09-06 ENCOUNTER — Ambulatory Visit
Admission: RE | Admit: 2024-09-06 | Discharge: 2024-09-06 | Disposition: A | Discharge status: Home / Self Care | Source: Ambulatory Visit | Attending: Obstetrics & Gynecology | Admitting: Obstetrics & Gynecology

## 2024-09-06 DIAGNOSIS — R92333 Mammographic heterogeneous density, bilateral breasts: Secondary | ICD-10-CM | POA: Diagnosis not present

## 2024-09-06 DIAGNOSIS — N6322 Unspecified lump in the left breast, upper inner quadrant: Secondary | ICD-10-CM | POA: Diagnosis not present

## 2024-09-06 DIAGNOSIS — N6002 Solitary cyst of left breast: Secondary | ICD-10-CM | POA: Diagnosis not present

## 2024-09-06 DIAGNOSIS — N644 Mastodynia: Secondary | ICD-10-CM | POA: Diagnosis not present

## 2024-10-10 ENCOUNTER — Other Ambulatory Visit: Payer: Self-pay

## 2024-10-18 ENCOUNTER — Ambulatory Visit (INDEPENDENT_AMBULATORY_CARE_PROVIDER_SITE_OTHER): Admitting: Family Medicine

## 2024-10-18 ENCOUNTER — Encounter: Payer: Self-pay | Admitting: Family Medicine

## 2024-10-18 ENCOUNTER — Other Ambulatory Visit: Payer: Self-pay

## 2024-10-18 VITALS — BP 114/85 | HR 70 | Temp 97.9°F | Ht 64.0 in | Wt 204.6 lb

## 2024-10-18 DIAGNOSIS — R0683 Snoring: Secondary | ICD-10-CM

## 2024-10-18 DIAGNOSIS — E669 Obesity, unspecified: Secondary | ICD-10-CM | POA: Diagnosis not present

## 2024-10-18 DIAGNOSIS — E782 Mixed hyperlipidemia: Secondary | ICD-10-CM | POA: Diagnosis not present

## 2024-10-18 DIAGNOSIS — R41840 Attention and concentration deficit: Secondary | ICD-10-CM

## 2024-10-18 DIAGNOSIS — G4733 Obstructive sleep apnea (adult) (pediatric): Secondary | ICD-10-CM | POA: Diagnosis not present

## 2024-10-18 MED ORDER — PHENTERMINE HCL 30 MG PO CAPS
30.0000 mg | ORAL_CAPSULE | ORAL | 0 refills | Status: AC
  Filled 2024-10-18: qty 30, 30d supply, fill #0

## 2024-10-18 NOTE — Assessment & Plan Note (Signed)
 Mixed hyperlipidemia Chronic  Managed with Crestor  10 mg. Recent cholesterol levels show LDL at 100 mg/dL and total cholesterol at 168 mg/dL, indicating good control. - continue Crestor  10mg  daily

## 2024-10-18 NOTE — Patient Instructions (Signed)
 To keep you healthy, please keep in mind the following health maintenance items that you are due for:   There are no preventive care reminders to display for this patient.   Best Wishes,   Dr. Lang

## 2024-10-18 NOTE — Progress Notes (Signed)
 Established patient visit   Patient: Pamela Hamilton   DOB: [birth year]   43 y.o. Female  MRN: [id number] Visit Date: 10/18/2024  Today's healthcare provider: Rockie Agent, MD   Chief Complaint  Patient presents with   Weight Check    Patient is present for follow up of weight and attention, doing well overall with no acute concerns today.    Subjective     HPI     Weight Check    Additional comments: Patient is present for follow up of weight and attention, doing well overall with no acute concerns today.       Last edited by Cherry Chiquita HERO, CMA on 10/18/2024  3:39 PM.       Discussed the use of AI scribe software for clinical note transcription with the patient, who gave verbal consent to proceed.  History of Present Illness Pamela Hamilton is a 43 year old female who presents for follow-up on weight management.  Her weight is 204 pounds and her BMI is 35. She is actively engaging in lifestyle modifications, achieving 10,000 steps daily except on weekends, and focusing on a diet of grilled and fresh foods, salads, and low-calorie sweets. She identifies carbohydrates as a dietary weakness, especially during family meals. No acute concerns were reported, and she states she is doing well.  She is currently prescribed a Mirena  IUD, Protonix  40 mg for GERD, and Crestor  10 mg for hyperlipidemia. She has a history of abnormal thyroid  function but was unable to tolerate levothyroxine in the past. Her cholesterol levels were last recorded with an LDL of 100 and a total cholesterol of 168.  She experiences constant tiredness despite adequate sleep and has a history of snoring, with a mild case identified through a home sleep test. She has not pursued further evaluation for sleep apnea.  She mentions a possible history of ADHD, noting focus issues and memory problems, which have been more noticeable since having children. Her husband has suggested seeing a  specialist for these symptoms. She has not been formally diagnosed with ADHD but recognizes similar symptoms in her youngest daughter, who has been diagnosed with auditory processing disorder.  She has a family history of diabetes, as her mother was on metformin but experienced significant GI side effects. She herself has GI issues and prefers to avoid medications like metformin that could exacerbate these symptoms.     Past Medical History:  Diagnosis Date   Abortion, missed    Allergy    Anemia    Anxiety    Arthritis    Asthma    EXERCISE INDUCED   BRCA negative 07/2022   MyRisk neg except MSH3 VUS   Family history of breast cancer    genetic testing letter sent 3/19, 7/20; 8/23 IBIS=15.5%/riskscore=9.1%   GERD (gastroesophageal reflux disease)    OCC NO MEDS   Headache    MIGRAINES   History of methicillin resistant staphylococcus aureus (MRSA) 02/2018   NASAL SWAB    History of strep sore throat    Hyperlipidemia    Hypothyroidism     Medications: Outpatient Medications Prior to Visit  Medication Sig   acetaminophen  (TYLENOL ) 500 MG tablet Take 1,000 mg by mouth 2 (two) times daily as needed for moderate pain or headache.   albuterol  (VENTOLIN  HFA) 108 (90 Base) MCG/ACT inhaler Inhale 1-2 puffs into the lungs every 6 (six) hours as needed.   fluticasone  (FLONASE ) 50 MCG/ACT nasal spray Place 2 sprays  into both nostrils daily.   fluticasone  (FLOVENT  HFA) 110 MCG/ACT inhaler Inhale 2 puffs into the lungs in the morning and at bedtime.   Ginger, Zingiber officinalis, (GINGER ROOT PO) Take by mouth.   Ginkgo Biloba 40 MG TABS Take by mouth.   ipratropium-albuterol  (DUONEB) 0.5-2.5 (3) MG/3ML SOLN Take 3 mLs by nebulization every 4 (four) hours as needed.   levonorgestrel  (MIRENA ) 20 MCG/DAY IUD 1 each by Intrauterine route once for 1 dose.   Menthol, Topical Analgesic, (ICY HOT EX) Apply 1 application topically daily as needed (pain).   Multiple Vitamin (MULTI-VITAMINS)  TABS Take 1 tablet by mouth every evening.    Multiple Vitamins-Minerals (ZINC PO) Take by mouth.   Omega-3 Fatty Acids (FISH OIL ULTRA) 1400 MG CAPS Take 1,400 mg by mouth every evening.   pantoprazole  (PROTONIX ) 40 MG tablet Take 1 tablet (40 mg total) by mouth daily.   promethazine -dextromethorphan (PROMETHAZINE -DM) 6.25-15 MG/5ML syrup Take 5 mLs by mouth 4 (four) times daily as needed for cough.   PROTEIN PO Take by mouth.   rosuvastatin  (CRESTOR ) 10 MG tablet Take 1 tablet (10 mg total) by mouth daily.   Wheat Dextrin (BENEFIBER PO) Take by mouth.   [DISCONTINUED] amoxicillin -clavulanate (AUGMENTIN ) 875-125 MG tablet Take 1 tablet by mouth every 12 (twelve) hours. (Patient not taking: Reported on 04/03/2024)   [DISCONTINUED] neomycin -polymyxin b-dexamethasone  (MAXITROL ) 3.5-10000-0.1 OINT Place small ribbon into the left eye at bedtime for 10 nights.   [DISCONTINUED] ofloxacin  (OCUFLOX ) 0.3 % ophthalmic solution Place 1 drop into the left eye 3 (three) times daily for 10 days   [DISCONTINUED] prednisoLONE  acetate (PRED FORTE ) 1 % ophthalmic suspension Place 1 drop into the left eye 4 (four) times daily for 10 days   [DISCONTINUED] prednisoLONE  acetate (PRED FORTE ) 1 % ophthalmic suspension Place 1 drop into the left eye 3 (three) times daily for 7 days, THEN 1 drop 2 (two) times daily for 14 days, THEN 1 drop once daily for 14 days.   No facility-administered medications prior to visit.    Review of Systems  Last metabolic panel Lab Results  Component Value Date   GLUCOSE 85 08/09/2024   NA 139 08/09/2024   K 4.4 08/09/2024   CL 102 08/09/2024   CO2 23 08/09/2024   BUN 14 08/09/2024   CREATININE 1.04 (H) 08/09/2024   EGFR 68 08/09/2024   CALCIUM  9.2 08/09/2024   PROT 6.7 08/09/2024   ALBUMIN 4.4 08/09/2024   LABGLOB 2.3 08/09/2024   BILITOT 1.0 08/09/2024   ALKPHOS 68 08/09/2024   AST 16 08/09/2024   ALT 17 08/09/2024   ANIONGAP 11 11/08/2023   Last lipids Lab Results   Component Value Date   CHOL 168 08/09/2024   HDL 40 08/09/2024   LDLCALC 108 (H) 08/09/2024   TRIG 112 08/09/2024   CHOLHDL 6.2 (H) 07/26/2023   The 10-year ASCVD risk score (Arnett DK, et al., 2019) is: 0.7%  Last hemoglobin A1c Lab Results  Component Value Date   HGBA1C 5.3 08/09/2024   Last thyroid  functions Lab Results  Component Value Date   TSH 4.480 08/09/2024   FREET4 0.88 08/09/2024   Last vitamin D  Lab Results  Component Value Date   VD25OH 36.3 02/14/2018   Last vitamin B12 and Folate Lab Results  Component Value Date   VITAMINB12 597 02/14/2018        Objective    BP 114/85 (BP Location: Left Arm, Patient Position: Sitting, Cuff Size: Normal)   Pulse 70  Temp 97.9 F (36.6 C) (Oral)   Ht 5' 4 (1.626 m)   Wt 204 lb 9.6 oz (92.8 kg)   SpO2 99%   BMI 35.12 kg/m   BP Readings from Last 3 Encounters:  10/18/24 114/85  08/09/24 114/79  04/03/24 118/75   Wt Readings from Last 3 Encounters:  10/18/24 204 lb 9.6 oz (92.8 kg)  08/09/24 201 lb 11.2 oz (91.5 kg)  04/03/24 203 lb (92.1 kg)        Physical Exam  Physical Exam VITALS: BP- 114/85 MEASUREMENTS: Weight- 204, BMI- 35.0. CHEST: Clear to auscultation bilaterally, no wheezes, rhonchi, or crackles. CARDIOVASCULAR: Normal heart rate and rhythm, S1 and S2 normal without murmurs.    No results found for any visits on 10/18/24.  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 She experiences difficulty with focus and memory, impacting daily life. No formal diagnosis yet. Referral to psychology for formal evaluation is planned. - Referred to psychology for formal evaluation of attention and concentration deficit. - Discussed potential for non-stimulant options if ADHD is diagnosed.       Relevant Orders   Ambulatory referral to Psychology   Mixed hyperlipidemia   Mixed hyperlipidemia Chronic  Managed with Crestor  10 mg. Recent cholesterol levels show LDL at 100 mg/dL and total cholesterol at 168 mg/dL, indicating good control. - continue Crestor  10mg  daily       Obesity (BMI 30-39.9) - Primary   Obesity, class 2 Chronic  Class 2 obesity with a BMI of 35. Weight is well-managed at 204 pounds. She is physically active, achieving 10,000 steps daily and engaging in 20-minute workouts. Dietary habits include low-calorie sweets and a focus on grilled and fresh foods, though she acknowledges difficulty with carbohydrate intake. She is interested in pharmacological options for weight loss, particularly those that may also increase energy levels. Phentermine is considered due to its potential to increase energy and suppress appetite, with a discussion of its side effects including increased blood pressure and heart rate. She is aware of the potential for weight regain after discontinuation of medications. - Prescribed phentermine 30 mg daily for weight loss and energy increase. - Scheduled follow-up in one month to assess tolerance and effectiveness of phentermine. - Discussed potential side effects of phentermine, including increased blood pressure and heart rate. - Advised monitoring blood pressure at home, especially if experiencing symptoms of elevated blood pressure.      Relevant Medications   phentermine 30 MG capsule   Other Visit Diagnoses       Snoring         Mild obstructive sleep apnea         Concentration deficit       Relevant Orders   Ambulatory referral to Psychology       Assessment and Plan Assessment & Plan    Obstructive sleep apnea, mild Chronic  Mild obstructive sleep apnea with snoring. Previous home sleep test indicated mild severity. She has the option to redo the home test or undergo a formal sleep study. - can consider repeat sleep study  in future if fatigue is not improved with weight loss       Return in about 2 months (around 12/18/2024) for ADHD, Weight MGMT(phentermine).         Rockie Agent, MD  [facility] [phone] (phone) [phone] (fax)  [facility]

## 2024-10-18 NOTE — Assessment & Plan Note (Signed)
  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 She experiences difficulty with focus and memory, impacting daily life. No formal diagnosis yet. Referral to psychology for formal evaluation is planned. - Referred to psychology for formal evaluation of attention and concentration deficit. - Discussed potential for non-stimulant options if ADHD is diagnosed.

## 2024-10-18 NOTE — Assessment & Plan Note (Signed)
 Obesity, class 2 Chronic  Class 2 obesity with a BMI of 35. Weight is well-managed at 204 pounds. She is physically active, achieving 10,000 steps daily and engaging in 20-minute workouts. Dietary habits include low-calorie sweets and a focus on grilled and fresh foods, though she acknowledges difficulty with carbohydrate intake. She is interested in pharmacological options for weight loss, particularly those that may also increase energy levels. Phentermine is considered due to its potential to increase energy and suppress appetite, with a discussion of its side effects including increased blood pressure and heart rate. She is aware of the potential for weight regain after discontinuation of medications. - Prescribed phentermine 30 mg daily for weight loss and energy increase. - Scheduled follow-up in one month to assess tolerance and effectiveness of phentermine. - Discussed potential side effects of phentermine, including increased blood pressure and heart rate. - Advised monitoring blood pressure at home, especially if experiencing symptoms of elevated blood pressure.

## 2024-11-16 ENCOUNTER — Other Ambulatory Visit (HOSPITAL_COMMUNITY): Payer: Self-pay

## 2024-11-19 ENCOUNTER — Other Ambulatory Visit: Payer: Self-pay

## 2024-11-20 NOTE — Progress Notes (Unsigned)
 11/21/2024 Pamela Hamilton [id number] 1981/01/31  Gastroenterology Office Note     Primary Care Physician:  Sharma Coyer, MD  Primary GI Provider: Jinny Carmine, MD    Chief Complaint   Chief Complaint  Patient presents with   New Patient (Initial Visit)    Hx diverticulitis earlier this year----LLQ pain- since December 5 feels like the same pain when she had diverticulitis- abd pain worsen Dec 5 then  but improved -after 3-4 days---denies any loose stools, vomiting, decrease appetite-chills but no fever     History of Present Illness   Pamela Hamilton is a 43 y.o. female with PMHX of diverticulitis presenting today with abdominal pain.  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 She reports she started having lower abdominal pain on 11/09/2024. It began with some left lower quadrant pain and then also having pain on right side. It seemed to get better for a few days and then over the weekend it was more intense.  She states the pain is not as severe as when she did have diverticulitis a year ago, but it is intense.  She reports pain today 5 out of 10 and was 7-8 over the weekend.  She is having some nausea lack of appetite, but no vomiting.  Intermittent chills but no fevers.  Denies changes in bowel pattern she takes Benefiber daily.   Patient last seen by Ellouise Console, PA on 01/02/2024 for follow-up with acute uncomplicated sigmoid diverticulitis treated with Augmentin .  Also complaints of lower abdominal cramping with intermittent diarrhea and mild constipation.   02/09/2024 colonoscopy - Diverticulosis in the sigmoid colon - Exam otherwise normal on direct and retroflexion views - No specimens collected - Repeat in 5 years for screening  11/09/2023 CT Abd Pelvis: 1. Sigmoid diverticulitis. No definite perforation at this time. No para colonic abscess. 2.  Aortic Atherosclerosis  Past Medical History:  Diagnosis Date   Abortion, missed    Allergy     Anemia    Anxiety    Arthritis    Asthma    EXERCISE INDUCED   BRCA negative 07/2022   MyRisk neg except MSH3 VUS   Family history of breast cancer    genetic testing letter sent 3/19, 7/20; 8/23 IBIS=15.5%/riskscore=9.1%   GERD (gastroesophageal reflux disease)    OCC NO MEDS   Headache    MIGRAINES   History of methicillin resistant staphylococcus aureus (MRSA) 02/2018   NASAL SWAB    History of strep sore throat    Hyperlipidemia    Hypothyroidism     Past Surgical History:  Procedure Laterality Date   BREAST CYST ASPIRATION Right 2008   BREAST CYST ASPIRATION Right 2009   COLONOSCOPY WITH PROPOFOL  N/A 02/09/2024   Procedure: COLONOSCOPY WITH PROPOFOL ;  Surgeon: Therisa Bi, MD;  Location: [facility] ENDOSCOPY;  Service: Gastroenterology;  Laterality: N/A;   DILATION AND CURETTAGE OF UTERUS     EYE SURGERY     X 3   SHOULDER ARTHROSCOPY Right    TONSILLECTOMY N/A 07/13/2018   Procedure: TONSILLECTOMY;  Surgeon: Milissa Hamming, MD;  Location: [facility] ORS;  Service: ENT;  Laterality: N/A;   TONSILLECTOMY     WISDOM TOOTH EXTRACTION      Current Outpatient Medications  Medication Sig Dispense Refill   acetaminophen  (TYLENOL ) 500 MG tablet Take 1,000 mg by mouth 2 (two) times daily as needed for moderate pain or headache.     albuterol  (VENTOLIN  HFA) 108 (90 Base) MCG/ACT inhaler Inhale 1-2  puffs into the lungs every 6 (six) hours as needed. 6.7 g 0   fluticasone  (FLONASE ) 50 MCG/ACT nasal spray Place 2 sprays into both nostrils daily. 16 g 6   fluticasone  (FLOVENT  HFA) 110 MCG/ACT inhaler Inhale 2 puffs into the lungs in the morning and at bedtime. 12 g 1   Ginger, Zingiber officinalis, (GINGER ROOT PO) Take by mouth.     Ginkgo Biloba 40 MG TABS Take by mouth.     ipratropium-albuterol  (DUONEB) 0.5-2.5 (3) MG/3ML SOLN Take 3 mLs by nebulization every 4 (four) hours as needed. 360 mL 0   levonorgestrel  (MIRENA ) 20 MCG/DAY IUD 1 each by Intrauterine route once for 1 dose. 1 each 0    Menthol, Topical Analgesic, (ICY HOT EX) Apply 1 application topically daily as needed (pain).     Multiple Vitamin (MULTI-VITAMINS) TABS Take 1 tablet by mouth every evening.      Multiple Vitamins-Minerals (ZINC PO) Take by mouth.     Omega-3 Fatty Acids (FISH OIL ULTRA) 1400 MG CAPS Take 1,400 mg by mouth every evening.     pantoprazole  (PROTONIX ) 40 MG tablet Take 1 tablet (40 mg total) by mouth daily. 60 tablet 3   phentermine  30 MG capsule Take 1 capsule (30 mg total) by mouth every morning. 30 capsule 0   promethazine -dextromethorphan (PROMETHAZINE -DM) 6.25-15 MG/5ML syrup Take 5 mLs by mouth 4 (four) times daily as needed for cough. 473 mL 1   PROTEIN PO Take by mouth.     rosuvastatin  (CRESTOR ) 10 MG tablet Take 1 tablet (10 mg total) by mouth daily. 90 tablet 3   Wheat Dextrin (BENEFIBER PO) Take by mouth.     No current facility-administered medications for this visit.    Allergies as of 11/21/2024 - Review Complete 11/21/2024  Allergen Reaction Noted   Versed [midazolam]  07/12/2018    Family History  Problem Relation Age of Onset   Heart disease Mother    Hypertension Mother    Hyperlipidemia Mother    Diabetes Mellitus II Mother    Kidney disease Mother    Hypothyroidism Mother    Sleep apnea Mother    Arrhythmia Father    Heart attack Father 87   Heart disease Father    CAD Father    Hyperlipidemia Father    Sleep apnea Father    Atrial fibrillation Father    Breast cancer Maternal Aunt 37   Diabetes Maternal Grandmother    Hypertension Maternal Grandmother    Heart disease Paternal Grandmother    Heart disease Paternal Grandfather    Breast cancer Cousin 35   Heart disease Other    Breast cancer Other    Breast cancer Other     Social History   Socioeconomic History   Marital status: Married    Spouse name: Not on file   Number of children: Not on file   Years of education: Not on file   Highest education level: Bachelor's degree (e.g., BA, AB,  BS)  Occupational History   Not on file  Tobacco Use   Smoking status: Never   Smokeless tobacco: Never  Vaping Use   Vaping status: Never Used  Substance and Sexual Activity   Alcohol use: Yes    Alcohol/week: 3.0 - 4.0 standard drinks of alcohol    Types: 3 - 4 Glasses of wine per week    Comment: OCC   Drug use: No   Sexual activity: Yes    Birth control/protection: I.U.D.  Comment: Mirena   Other Topics Concern   Not on file  Social History Narrative   Not on file   Social Drivers of Health   Tobacco Use: Low Risk (11/21/2024)   Patient History    Smoking Tobacco Use: Never    Smokeless Tobacco Use: Never    Passive Exposure: Not on file  Financial Resource Strain: Low Risk (08/09/2024)   Overall Financial Resource Strain (CARDIA)    Difficulty of Paying Living Expenses: Not hard at all  Food Insecurity: No Food Insecurity (08/09/2024)   Epic    Worried About Radiation Protection Practitioner of Food in the Last Year: Never true    Ran Out of Food in the Last Year: Never true  Transportation Needs: No Transportation Needs (08/09/2024)   Epic    Lack of Transportation (Medical): No    Lack of Transportation (Non-Medical): No  Physical Activity: Sufficiently Active (08/09/2024)   Exercise Vital Sign    Days of Exercise per Week: 5 days    Minutes of Exercise per Session: 30 min  Stress: No Stress Concern Present (08/09/2024)   [employer] of Occupational Health - Occupational Stress Questionnaire    Feeling of Stress: Only a little  Social Connections: Moderately Integrated (08/09/2024)   Social Connection and Isolation Panel    Frequency of Communication with Friends and Family: More than three times a week    Frequency of Social Gatherings with Friends and Family: Twice a week    Attends Religious Services: 1 to 4 times per year    Active Member of Clubs or Organizations: No    Attends Banker Meetings: Not on file    Marital Status: Married  Intimate Partner Violence:  Not At Risk (04/03/2024)   Humiliation, Afraid, Rape, and Kick questionnaire    Fear of Current or Ex-Partner: No    Emotionally Abused: No    Physically Abused: No    Sexually Abused: No  Depression (PHQ2-9): Low Risk (10/18/2024)   Depression (PHQ2-9)    PHQ-2 Score: 4  Alcohol Screen: Low Risk (08/09/2024)   Alcohol Screen    Last Alcohol Screening Score (AUDIT): 3  Housing: Low Risk (08/09/2024)   Epic    Unable to Pay for Housing in the Last Year: No    Number of Times Moved in the Last Year: 0    Homeless in the Last Year: No  Utilities: Not At Risk (04/03/2024)   AHC Utilities    Threatened with loss of utilities: No  Health Literacy: Adequate Health Literacy (04/03/2024)   B1300 Health Literacy    Frequency of need for help with medical instructions: Never     RELEVANT GI HISTORY, IMAGING AND LABS: CBC    Component Value Date/Time   WBC 7.5 08/09/2024 1605   WBC 15.8 (H) 11/08/2023 2351   RBC 4.37 08/09/2024 1605   RBC 4.91 11/08/2023 2351   HGB 13.8 08/09/2024 1605   HCT 41.3 08/09/2024 1605   PLT 264 08/09/2024 1605   MCV 95 08/09/2024 1605   MCV 94 12/01/2012 1116   MCH 31.6 08/09/2024 1605   MCH 31.6 11/08/2023 2351   MCHC 33.4 08/09/2024 1605   MCHC 34.1 11/08/2023 2351   RDW 12.8 08/09/2024 1605   RDW 14.1 12/01/2012 1116   LYMPHSABS 1.6 12/01/2012 1116   MONOABS 0.7 12/01/2012 1116   EOSABS 0.1 12/01/2012 1116   BASOSABS 0.1 12/01/2012 1116   Recent Labs    08/09/24 1605  HGB 13.8  CMP     Component Value Date/Time   NA 139 08/09/2024 1605   K 4.4 08/09/2024 1605   CL 102 08/09/2024 1605   CO2 23 08/09/2024 1605   GLUCOSE 85 08/09/2024 1605   GLUCOSE 114 (H) 11/08/2023 2351   BUN 14 08/09/2024 1605   CREATININE 1.04 (H) 08/09/2024 1605   CALCIUM  9.2 08/09/2024 1605   PROT 6.7 08/09/2024 1605   ALBUMIN 4.4 08/09/2024 1605   AST 16 08/09/2024 1605   ALT 17 08/09/2024 1605   ALKPHOS 68 08/09/2024 1605   BILITOT 1.0 08/09/2024 1605    GFRNONAA >60 11/08/2023 2351      Latest Ref Rng & Units 08/09/2024    4:05 PM 11/08/2023   11:51 PM 07/26/2023    9:17 AM  Hepatic Function  Total Protein 6.0 - 8.5 g/dL 6.7  8.1  6.4   Albumin 3.9 - 4.9 g/dL 4.4  4.6  4.3   AST 0 - 40 IU/L 16  20  19    ALT 0 - 32 IU/L 17  23  20    Alk Phosphatase 44 - 121 IU/L 68  72  73   Total Bilirubin 0.0 - 1.2 mg/dL 1.0  1.4  0.8       Review of Systems   All systems reviewed and negative except where noted in HPI.    Physical Exam  BP 129/85   Pulse 92   Temp 98.3 F (36.8 C)   Ht 5' 4 (1.626 m)   Wt 201 lb (91.2 kg)   SpO2 97%   BMI 34.50 kg/m  No LMP recorded. (Menstrual status: IUD). General:   Alert and oriented. Pleasant and cooperative. Well-nourished and well-developed.  Head:  Normocephalic and atraumatic. Eyes:  Without icterus Ears:  Normal auditory acuity. Lungs:  Respirations even and unlabored.  Clear throughout to auscultation.   No wheezes, crackles, or rhonchi. No acute distress. Heart:  Regular rate and rhythm; no murmurs, clicks, rubs, or gallops. Abdomen:  Normal bowel sounds.  No bruits.  Guarding and tender to palpation left lower quadrant, soft, non-distended without masses, hepatosplenomegaly or hernias noted.  No rebound tenderness. Negative Carnett sign. Rectal:  Deferred. Msk:  Symmetrical without gross deformities. Normal posture. Extremities:  Without edema. Neurologic:  Alert and  oriented x4;  grossly normal neurologically. Skin:  Intact without significant lesions or rashes. Psych:  Alert and cooperative. Normal mood and affect.   Assessment & Plan   Pamela Hamilton is a 43 y.o. female presenting today with right and left lower quadrant abdominal pain.  Her pain is worse left lower quadrant. Patient was treated for diverticulitis in December 2024 and had colonoscopy 02/2024.  She reports her pain is not as intense as when she had prior episode but she is very concerned for recurrent  diverticulitis.  Will order stat CT abdomen pelvis.  Will also check CBC for for signs of infection.  Further recommendations pending labs and imaging.   Grayce Bohr, DNP, AGNP-C [facility]

## 2024-11-21 ENCOUNTER — Ambulatory Visit: Admitting: Family Medicine

## 2024-11-21 ENCOUNTER — Ambulatory Visit: Payer: Self-pay | Admitting: Family Medicine

## 2024-11-21 ENCOUNTER — Other Ambulatory Visit
Admission: RE | Admit: 2024-11-21 | Discharge: 2024-11-21 | Disposition: A | Discharge status: Home / Self Care | Source: Ambulatory Visit | Attending: Family Medicine | Admitting: Family Medicine

## 2024-11-21 ENCOUNTER — Encounter: Payer: Self-pay | Admitting: Family Medicine

## 2024-11-21 ENCOUNTER — Ambulatory Visit
Admission: RE | Admit: 2024-11-21 | Discharge: 2024-11-21 | Disposition: A | Discharge status: Home / Self Care | Source: Ambulatory Visit | Attending: Family Medicine | Admitting: Family Medicine

## 2024-11-21 VITALS — BP 129/85 | HR 92 | Temp 98.3°F | Ht 64.0 in | Wt 201.0 lb

## 2024-11-21 DIAGNOSIS — R1032 Left lower quadrant pain: Secondary | ICD-10-CM | POA: Diagnosis not present

## 2024-11-21 DIAGNOSIS — R1031 Right lower quadrant pain: Secondary | ICD-10-CM

## 2024-11-21 DIAGNOSIS — K429 Umbilical hernia without obstruction or gangrene: Secondary | ICD-10-CM | POA: Diagnosis not present

## 2024-11-21 DIAGNOSIS — K449 Diaphragmatic hernia without obstruction or gangrene: Secondary | ICD-10-CM | POA: Diagnosis not present

## 2024-11-21 DIAGNOSIS — R109 Unspecified abdominal pain: Secondary | ICD-10-CM | POA: Diagnosis not present

## 2024-11-21 DIAGNOSIS — K5730 Diverticulosis of large intestine without perforation or abscess without bleeding: Secondary | ICD-10-CM | POA: Diagnosis not present

## 2024-11-21 LAB — CBC WITH DIFFERENTIAL/PLATELET
Abs Immature Granulocytes: 0.03 K/uL (ref 0.00–0.07)
Basophils Absolute: 0 K/uL (ref 0.0–0.1)
Basophils Relative: 0 %
Eosinophils Absolute: 0.2 K/uL (ref 0.0–0.5)
Eosinophils Relative: 3 %
HCT: 39.6 % (ref 36.0–46.0)
Hemoglobin: 13.8 g/dL (ref 12.0–15.0)
Immature Granulocytes: 0 %
Lymphocytes Relative: 25 %
Lymphs Abs: 2.3 K/uL (ref 0.7–4.0)
MCH: 31.2 pg (ref 26.0–34.0)
MCHC: 34.8 g/dL (ref 30.0–36.0)
MCV: 89.6 fL (ref 80.0–100.0)
Monocytes Absolute: 0.6 K/uL (ref 0.1–1.0)
Monocytes Relative: 7 %
Neutro Abs: 6 K/uL (ref 1.7–7.7)
Neutrophils Relative %: 65 %
Platelets: 314 K/uL (ref 150–400)
RBC: 4.42 MIL/uL (ref 3.87–5.11)
RDW: 12.9 % (ref 11.5–15.5)
WBC: 9.2 K/uL (ref 4.0–10.5)
nRBC: 0 % (ref 0.0–0.2)

## 2024-11-21 MED ORDER — IOHEXOL 300 MG/ML  SOLN
100.0000 mL | Freq: Once | INTRAMUSCULAR | Status: AC | PRN
  Administered 2024-11-21: 14:00:00 100 mL via INTRAVENOUS

## 2024-11-21 NOTE — Patient Instructions (Signed)
 Please go to Outpatient imaging at 12:30 today for the CT scan- you will drink the oral contrast and then they will start the imaging afterwards. Please go to lab when leaving the office -you may go to [facility] lab.

## 2024-11-22 ENCOUNTER — Encounter: Payer: Self-pay | Admitting: Surgery

## 2024-11-22 ENCOUNTER — Ambulatory Visit: Payer: Self-pay | Admitting: Family Medicine

## 2024-11-22 ENCOUNTER — Telehealth: Payer: Self-pay

## 2024-11-22 MED ORDER — IBUPROFEN 800 MG PO TABS
800.0000 mg | ORAL_TABLET | Freq: Three times a day (TID) | ORAL | 0 refills | Status: DC | PRN
  Filled 2024-11-22: qty 30, 10d supply, fill #0

## 2024-11-22 NOTE — Telephone Encounter (Signed)
 Patient notified- I let her know if symptoms worsen over the weekend to go to ED.  CT with uncomplicated diverticulitis.    Dorothe, please call her and let her know the CT does show uncomplicated diverticulitis. She is not having fevers and WBC is normal. I have discussed with Dr. Jinny and will treat conservatively at this time. She can do a clear liquid diet for a few days and then slowly advance diet. She can tylenol  for pain. If her symptoms get worse, can't keep any food down, or febrile, let me know. Thanks   RE

## 2024-11-22 NOTE — Telephone Encounter (Signed)
 Patient called office at this time wanting to know CT results- Radiology has not read yet so I called to ask Radiologist to read- I let patient know once we have results we would call her back.

## 2024-11-23 ENCOUNTER — Other Ambulatory Visit: Payer: Self-pay | Admitting: Family Medicine

## 2024-11-23 ENCOUNTER — Other Ambulatory Visit: Payer: Self-pay

## 2024-11-23 ENCOUNTER — Other Ambulatory Visit (HOSPITAL_COMMUNITY): Payer: Self-pay

## 2024-11-23 DIAGNOSIS — E669 Obesity, unspecified: Secondary | ICD-10-CM

## 2024-11-23 MED FILL — Phentermine HCl Cap 30 MG: ORAL | 30 days supply | Qty: 30 | Fill #0 | Status: AC

## 2024-11-24 ENCOUNTER — Other Ambulatory Visit: Payer: Self-pay

## 2024-11-26 ENCOUNTER — Emergency Department: Admission: EM | Admit: 2024-11-26 | Discharge: 2024-11-26 | Discharge status: Against Medical Advice

## 2024-11-26 ENCOUNTER — Other Ambulatory Visit: Payer: Self-pay

## 2024-11-30 ENCOUNTER — Other Ambulatory Visit: Payer: Self-pay

## 2024-12-10 ENCOUNTER — Telehealth: Payer: Self-pay

## 2024-12-10 ENCOUNTER — Other Ambulatory Visit: Payer: Self-pay | Admitting: Family Medicine

## 2024-12-10 DIAGNOSIS — K5792 Diverticulitis of intestine, part unspecified, without perforation or abscess without bleeding: Secondary | ICD-10-CM

## 2024-12-10 MED ORDER — AMOXICILLIN-POT CLAVULANATE 875-125 MG PO TABS: 1.0000 | ORAL_TABLET | Freq: Two times a day (BID) | ORAL | 0 refills | Status: DC

## 2024-12-10 NOTE — Telephone Encounter (Signed)
 I spoke with patient- I let her know you advised going to ED -however she stated she could not miss work- she stated she would try seeing her careers adviser.

## 2024-12-10 NOTE — Telephone Encounter (Signed)
 Pt pt was seen Dec 17 , 2025. Pt is having a flare up of diverticulitis and would like some medication. PT states the pain is back once she comes off the clear liquids.   PT  has been on clear liquids and pt states pain is now worse. Pain is now moving down her leg.

## 2024-12-10 NOTE — Progress Notes (Signed)
 Called patient myself. LLQ pain continues at 2-3/10. She is also taking ibuprofen  800 mg. Will send in Augmentin  for 10 days. Patient instructed that she will need to go to the emergency room if abdominal pain worsens or does not improve after this course of antibiotic therapy for uncomplicated diverticulitis.

## 2024-12-10 NOTE — Telephone Encounter (Addendum)
 Please advise-------LLQ pain -started last night-denies fever ,chills, nausea or vomiting- intermittent back pain shooting down left leg-she began the liquid diet - no diarrhea- when she hold her urine or bends down she has the pain. The pain is in the same location as the diverticulitis. She was treated with clear liquid diet only no antibiotic- pain is more localized in LLQ area. One of the surgeons fro the OR wrote prescription for ibuprofen .

## 2024-12-20 ENCOUNTER — Ambulatory Visit: Payer: Self-pay | Admitting: Surgery

## 2024-12-20 DIAGNOSIS — K5732 Diverticulitis of large intestine without perforation or abscess without bleeding: Secondary | ICD-10-CM

## 2025-01-07 ENCOUNTER — Other Ambulatory Visit: Payer: Self-pay

## 2025-01-07 ENCOUNTER — Encounter
Admission: RE | Admit: 2025-01-07 | Discharge: 2025-01-07 | Disposition: A | Source: Ambulatory Visit | Attending: Surgery

## 2025-01-07 ENCOUNTER — Inpatient Hospital Stay
Admission: RE | Admit: 2025-01-07 | Discharge: 2025-01-07 | Disposition: A | Source: Ambulatory Visit | Attending: Surgery

## 2025-01-07 VITALS — Ht 63.5 in | Wt 195.0 lb

## 2025-01-07 DIAGNOSIS — K5732 Diverticulitis of large intestine without perforation or abscess without bleeding: Secondary | ICD-10-CM | POA: Insufficient documentation

## 2025-01-07 DIAGNOSIS — Z01812 Encounter for preprocedural laboratory examination: Secondary | ICD-10-CM | POA: Insufficient documentation

## 2025-01-07 DIAGNOSIS — Z6830 Body mass index (BMI) 30.0-30.9, adult: Secondary | ICD-10-CM | POA: Insufficient documentation

## 2025-01-07 DIAGNOSIS — E669 Obesity, unspecified: Secondary | ICD-10-CM | POA: Insufficient documentation

## 2025-01-07 LAB — BASIC METABOLIC PANEL WITH GFR
Anion gap: 12 (ref 5–15)
BUN: 12 mg/dL (ref 6–20)
CO2: 25 mmol/L (ref 22–32)
Calcium: 9.3 mg/dL (ref 8.9–10.3)
Chloride: 102 mmol/L (ref 98–111)
Creatinine, Ser: 0.8 mg/dL (ref 0.44–1.00)
GFR, Estimated: >60 mL/min
Glucose, Bld: 89 mg/dL (ref 70–99)
Potassium: 4 mmol/L (ref 3.5–5.1)
Sodium: 140 mmol/L (ref 135–145)

## 2025-01-07 LAB — HEMOGLOBIN A1C
Hgb A1c MFr Bld: 5.4 % (ref 4.8–5.6)
Mean Plasma Glucose: 108.28 mg/dL

## 2025-01-10 ENCOUNTER — Ambulatory Visit: Admitting: Family Medicine

## 2025-01-10 ENCOUNTER — Encounter: Payer: Self-pay | Admitting: Family Medicine

## 2025-01-10 VITALS — BP 119/89 | HR 96 | Ht 63.5 in | Wt 189.8 lb

## 2025-01-10 DIAGNOSIS — G4710 Hypersomnia, unspecified: Secondary | ICD-10-CM

## 2025-01-10 DIAGNOSIS — E669 Obesity, unspecified: Secondary | ICD-10-CM

## 2025-01-10 DIAGNOSIS — S0990XD Unspecified injury of head, subsequent encounter: Secondary | ICD-10-CM

## 2025-01-10 NOTE — Patient Instructions (Signed)
 To keep you healthy, please keep in mind the following health maintenance items that you are due for:   Health Maintenance Due  Topic Date Due   HIV Screening  Never done   Hepatitis C Screening  Never done   COVID-19 Vaccine (5 - 2025-26 season) 08/06/2024     Best Wishes,   Dr. Lang

## 2025-01-10 NOTE — Progress Notes (Unsigned)
 "  Established Patient Office Visit  Patient ID: Pamela Hamilton, female    DOB: 1981-08-15  Age: 44 y.o. MRN: [id number] PCP: Sharma Coyer, MD  Chief Complaint  Patient presents with   Medical Management of Chronic Issues    Patient is present for f/u ADHD, weight mgt. Taking phentermine , has lost some weigh but has also been on clear liquid diet since December due to GI issues. Surgery Monday 2/9     Subjective:     HPI  Discussed the use of AI scribe software for clinical note transcription with the patient, who gave verbal consent to proceed.  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   Family history of breast cancer 07/02/2022   Family history of premature coronary artery disease 04/27/2018   Mixed hyperlipidemia 04/27/2018      ROS    Objective:     BP 119/89 (BP Location: Left Arm, Patient Position: Sitting, Cuff Size: Normal)   Pulse 96   Ht 5' 3.5 (1.613 m)   Wt 189 lb 12.8 oz (86.1 kg)   LMP 11/21/2024 (Approximate)   SpO2 100%   BMI 33.09 kg/m  BP Readings from Last 3 Encounters:  01/10/25 119/89  11/21/24 129/85  10/18/24 114/85   Wt Readings from Last 3 Encounters:  01/10/25 189 lb 12.8 oz (86.1 kg)  01/07/25 195 lb (88.5 kg)  11/21/24 201 lb (91.2 kg)      Physical Exam Vitals reviewed.  Constitutional:      General: She is not in acute distress.    Appearance: Normal appearance. She is not ill-appearing.  Cardiovascular:     Rate and Rhythm: Normal rate and regular rhythm.  Pulmonary:     Effort: Pulmonary effort is normal. No respiratory  distress.     Breath sounds: No wheezing, rhonchi or rales.  Neurological:     Mental Status: She is alert and oriented to person, place, and time.  Psychiatric:        Mood and Affect: Mood normal.        Behavior: Behavior normal.     {PhysExam Abridge (Optional):[id number]} No results found for any visits on 01/10/25.  Last CBC Lab Results  Component Value Date   WBC 9.2 11/21/2024   HGB 13.8 11/21/2024   HCT 39.6 11/21/2024   MCV 89.6 11/21/2024   MCH 31.2 11/21/2024   RDW 12.9 11/21/2024   PLT 314 11/21/2024   Last metabolic panel Lab Results  Component Value Date   GLUCOSE 89 01/07/2025   NA 140 01/07/2025   K 4.0 01/07/2025   CL 102 01/07/2025   CO2 25 01/07/2025   BUN 12 01/07/2025   CREATININE 0.80 01/07/2025   GFRNONAA >60 01/07/2025   CALCIUM  9.3 01/07/2025   PROT 6.7 08/09/2024   ALBUMIN 4.4 08/09/2024   LABGLOB 2.3 08/09/2024   BILITOT 1.0 08/09/2024   ALKPHOS 68 08/09/2024   AST 16 08/09/2024   ALT 17 08/09/2024   ANIONGAP 12 01/07/2025   Last lipids Lab Results  Component Value Date   CHOL 168 08/09/2024   HDL 40 08/09/2024   LDLCALC 108 (H) 08/09/2024  TRIG 112 08/09/2024   CHOLHDL 6.2 (H) 07/26/2023   Last hemoglobin A1c Lab Results  Component Value Date   HGBA1C 5.4 01/07/2025   Last thyroid  functions Lab Results  Component Value Date   TSH 4.480 08/09/2024   FREET4 0.88 08/09/2024   Last vitamin D  Lab Results  Component Value Date   VD25OH 36.3 02/14/2018   Last vitamin B12 and Folate Lab Results  Component Value Date   VITAMINB12 597 02/14/2018      The 10-year ASCVD risk score (Arnett DK, et al., 2019) is: 0.8%  Outpatient Encounter Medications as of 01/10/2025  Medication Sig Note   acetaminophen  (TYLENOL ) 500 MG tablet Take 1,000 mg by mouth every 6 (six) hours as needed for moderate pain (pain score 4-6) or headache.    albuterol  (VENTOLIN  HFA) 108 (90 Base) MCG/ACT inhaler Inhale 1-2 puffs into the lungs every  6 (six) hours as needed.    fluticasone  (FLONASE ) 50 MCG/ACT nasal spray Place 2 sprays into both nostrils daily.    fluticasone  (FLOVENT  HFA) 110 MCG/ACT inhaler Inhale 2 puffs into the lungs in the morning and at bedtime. (Patient taking differently: Inhale 2 puffs into the lungs 2 (two) times daily as needed (shortness of breath or wheezing).)    Ginger, Zingiber officinalis, (GINGER ROOT PO) Take 1 tablet by mouth daily.    Ginkgo Biloba 40 MG TABS Take 40 mg by mouth daily.    ipratropium-albuterol  (DUONEB) 0.5-2.5 (3) MG/3ML SOLN Take 3 mLs by nebulization every 4 (four) hours as needed.    levonorgestrel  (MIRENA ) 20 MCG/DAY IUD 1 each by Intrauterine route once for 1 dose.    Multiple Vitamin (MULTI-VITAMINS) TABS Take 1 tablet by mouth every evening.     Multiple Vitamins-Minerals (ZINC PO) Take 1 tablet by mouth daily.    Omega-3 Fatty Acids (FISH OIL ULTRA) 1400 MG CAPS Take 1,400 mg by mouth every evening.    pantoprazole  (PROTONIX ) 40 MG tablet Take 1 tablet (40 mg total) by mouth daily.    PROTEIN PO Take by mouth.    rosuvastatin  (CRESTOR ) 10 MG tablet Take 1 tablet (10 mg total) by mouth daily.    Wheat Dextrin (BENEFIBER PO) Take 1 Scoop by mouth daily.    [DISCONTINUED] phentermine  30 MG capsule Take 1 capsule (30 mg total) by mouth every morning. 01/10/2025: will cycle off for 3 months until may 2026   [DISCONTINUED] ibuprofen  (ADVIL ) 800 MG tablet Take 1 tablet (800 mg total) by mouth every 8 (eight) hours as needed. (Patient not taking: Reported on 01/10/2025)    No facility-administered encounter medications on file as of 01/10/2025.       Assessment & Plan:   Problem List Items Addressed This Visit     Obesity (BMI 30-39.9) - Primary    Assessment and Plan Assessment & Plan     No follow-ups on file.    Rockie Agent, MD [facility]  "

## 2025-01-14 ENCOUNTER — Encounter: Admission: RE | Payer: Self-pay | Source: Home / Self Care

## 2025-01-14 ENCOUNTER — Encounter: Payer: Self-pay | Admitting: Urgent Care

## 2025-01-14 ENCOUNTER — Encounter: Payer: Self-pay | Admitting: Certified Registered"

## 2025-01-14 ENCOUNTER — Inpatient Hospital Stay: Admission: RE | Admit: 2025-01-14 | Admitting: Surgery

## 2025-01-14 SURGERY — COLECTOMY, SIGMOID, ROBOT-ASSISTED
Anesthesia: General

## 2025-04-25 ENCOUNTER — Ambulatory Visit: Admitting: Family Medicine
